# Patient Record
Sex: Female | Born: 1970 | Race: Black or African American | Hispanic: Yes | Marital: Married | Smoking: Never smoker
Health system: Southern US, Community
[De-identification: ages and names within clinical notes are randomized; demographics above are authoritative.]

## PROBLEM LIST (undated history)

## (undated) ENCOUNTER — Emergency Department: Disposition: A | Discharge status: Home / Self Care | Payer: Medicaid Other

## (undated) DIAGNOSIS — E669 Obesity, unspecified: Secondary | ICD-10-CM

## (undated) DIAGNOSIS — M545 Low back pain, unspecified: Secondary | ICD-10-CM

## (undated) DIAGNOSIS — J45909 Unspecified asthma, uncomplicated: Secondary | ICD-10-CM

## (undated) DIAGNOSIS — M1990 Unspecified osteoarthritis, unspecified site: Secondary | ICD-10-CM

## (undated) DIAGNOSIS — M3500 Sicca syndrome, unspecified: Secondary | ICD-10-CM

## (undated) DIAGNOSIS — L300 Nummular dermatitis: Secondary | ICD-10-CM

## (undated) DIAGNOSIS — E119 Type 2 diabetes mellitus without complications: Secondary | ICD-10-CM

## (undated) DIAGNOSIS — N83209 Unspecified ovarian cyst, unspecified side: Secondary | ICD-10-CM

## (undated) DIAGNOSIS — G4730 Sleep apnea, unspecified: Secondary | ICD-10-CM

## (undated) DIAGNOSIS — M4802 Spinal stenosis, cervical region: Secondary | ICD-10-CM

## (undated) HISTORY — PX: CHOLECYSTECTOMY: SHX55

## (undated) HISTORY — PX: ADENOIDECTOMY: SUR15

## (undated) HISTORY — PX: TONSILLECTOMY: SUR1361

---

## 2014-01-14 DIAGNOSIS — M545 Low back pain, unspecified: Secondary | ICD-10-CM | POA: Insufficient documentation

## 2014-03-13 DIAGNOSIS — M4802 Spinal stenosis, cervical region: Secondary | ICD-10-CM | POA: Insufficient documentation

## 2014-03-13 DIAGNOSIS — Z0000 Encounter for general adult medical examination without abnormal findings: Secondary | ICD-10-CM | POA: Insufficient documentation

## 2014-09-26 DIAGNOSIS — M3500 Sicca syndrome, unspecified: Secondary | ICD-10-CM | POA: Insufficient documentation

## 2014-12-05 DIAGNOSIS — N83202 Unspecified ovarian cyst, left side: Secondary | ICD-10-CM | POA: Insufficient documentation

## 2014-12-05 DIAGNOSIS — D259 Leiomyoma of uterus, unspecified: Secondary | ICD-10-CM | POA: Insufficient documentation

## 2015-05-15 DIAGNOSIS — L300 Nummular dermatitis: Secondary | ICD-10-CM | POA: Insufficient documentation

## 2015-05-15 DIAGNOSIS — G4733 Obstructive sleep apnea (adult) (pediatric): Secondary | ICD-10-CM | POA: Insufficient documentation

## 2016-04-05 DIAGNOSIS — J4520 Mild intermittent asthma, uncomplicated: Secondary | ICD-10-CM | POA: Insufficient documentation

## 2017-03-25 ENCOUNTER — Other Ambulatory Visit: Payer: Self-pay

## 2017-03-25 ENCOUNTER — Emergency Department
Admission: EM | Admit: 2017-03-25 | Discharge: 2017-03-25 | Disposition: A | Discharge status: Home / Self Care | Payer: Medicaid Other | Source: Home / Self Care | Attending: Family Medicine | Admitting: Family Medicine

## 2017-03-25 ENCOUNTER — Encounter: Payer: Self-pay | Admitting: *Deleted

## 2017-03-25 DIAGNOSIS — B9689 Other specified bacterial agents as the cause of diseases classified elsewhere: Secondary | ICD-10-CM | POA: Diagnosis not present

## 2017-03-25 DIAGNOSIS — J019 Acute sinusitis, unspecified: Secondary | ICD-10-CM | POA: Diagnosis not present

## 2017-03-25 HISTORY — DX: Type 2 diabetes mellitus without complications: E11.9

## 2017-03-25 LAB — POCT URINALYSIS DIP (MANUAL ENTRY)
BILIRUBIN UA: NEGATIVE
Blood, UA: NEGATIVE
Ketones, POC UA: NEGATIVE mg/dL
Nitrite, UA: NEGATIVE
Protein Ur, POC: NEGATIVE mg/dL
Spec Grav, UA: >=1.03 — AB (ref 1.010–1.025)
Urobilinogen, UA: 0.2 E.U./dL
pH, UA: 5.5 (ref 5.0–8.0)

## 2017-03-25 MED ORDER — PREDNISONE 20 MG PO TABS: ORAL_TABLET | ORAL | 0 refills | Status: DC

## 2017-03-25 MED ORDER — AZITHROMYCIN 250 MG PO TABS: 250.0000 mg | ORAL_TABLET | Freq: Every day | ORAL | 0 refills | Status: DC

## 2017-03-25 NOTE — Discharge Instructions (Signed)
°  You may take 500mg acetaminophen every 4-6 hours or in combination with ibuprofen 400-600mg every 6-8 hours as needed for pain, inflammation, and fever. ° °Be sure to drink at least eight 8oz glasses of water to stay well hydrated and get at least 8 hours of sleep at night, preferably more while sick.  ° °Please take antibiotics as prescribed and be sure to complete entire course even if you start to feel better to ensure infection does not come back. ° °

## 2017-03-25 NOTE — ED Triage Notes (Signed)
Pt c/o nasal congestion, productive cough, sinus pressure and bilateral ear pain x 2 wks. She also c/o dysuria x 10 days.

## 2017-03-25 NOTE — ED Provider Notes (Signed)
[name]    CSN: [id number] Arrival date & time: 03/25/17  1609     History   Chief Complaint Chief Complaint  Patient presents with  . Cough  . Dysuria    HPI Sierra Henderson is a 46 y.o. female.   HPI Sierra Henderson is a 46 y.o. female presenting to UC with c/o gradually worsening chest tightness with cough, congestion, hoarse voice, sinus pressure and pain for 2 weeks. Associated bilateral ear pain.  She has also had dysuria for 10 days but denies hematuria or frequency.  She has taken OTC cough/cold medication with minimal relief. Hx of sinus infections, symptoms feel similar. Pt recently moved from [place].  Pt notes she does well with Azithromycin but has stomach upset from Augmentin.    Past Medical History:  Diagnosis Date  . Diabetes mellitus without complication ([place])     There are no active problems to display for this patient.   Past Surgical History:  Procedure Laterality Date  . ADENOIDECTOMY    . CESAREAN SECTION    . CHOLECYSTECTOMY    . TONSILLECTOMY      OB History    No data available       Home Medications    Prior to Admission medications   Medication Sig Start Date End Date Taking? Authorizing Provider  glipiZIDE (GLUCOTROL) 10 MG tablet Take 10 mg by mouth daily before breakfast.   Yes [provider]  azithromycin (ZITHROMAX) 250 MG tablet Take 1 tablet (250 mg total) by mouth daily. Take first 2 tablets together, then 1 every day until finished. 03/25/17   Noe Gens, PA-C  predniSONE (DELTASONE) 20 MG tablet 3 tabs po day one, then 2 po daily x 4 days 03/25/17   Noe Gens, PA-C    Family History Family History  Problem Relation Age of Onset  . Hypertension Mother   . Diabetes Mother   . Hypertension Father   . Diabetes Father     Social History Social History   Tobacco Use  . Smoking status: Never Smoker  . Smokeless tobacco: Never Used  Substance Use Topics  . Alcohol use: Yes   Comment: socially  . Drug use: No     Allergies   Latex; Metformin and related; and Augmentin [amoxicillin-pot clavulanate]   Review of Systems Review of Systems  Constitutional: Negative for chills and fever.  HENT: Positive for congestion, ear pain (bilateral), postnasal drip, sinus pressure, sinus pain and sore throat. Negative for trouble swallowing and voice change.   Respiratory: Positive for cough. Negative for shortness of breath.   Cardiovascular: Negative for chest pain and palpitations.  Gastrointestinal: Negative for abdominal pain, diarrhea, nausea and vomiting.  Musculoskeletal: Negative for arthralgias, back pain and myalgias.  Skin: Negative for rash.  Neurological: Positive for headaches. Negative for dizziness and light-headedness.     Physical Exam Triage Vital Signs ED Triage Vitals  Enc Vitals Group     BP 03/25/17 1639 (!) 143/79     Pulse Rate 03/25/17 1639 100     Resp 03/25/17 1639 18     Temp 03/25/17 1639 98.5 F (36.9 C)     Temp Source 03/25/17 1639 Oral     SpO2 03/25/17 1639 96 %     Weight 03/25/17 1640 190 lb (86.2 kg)     Height 03/25/17 1640 5\' 3"  (1.6 m)     Head Circumference --      Peak Flow --  Pain Score 03/25/17 1640 0     Pain Loc --      Pain Edu? --      Excl. in [place]? --    No data found.  Updated Vital Signs BP (!) 143/79 (BP Location: Right Arm)   Pulse 100   Temp 98.5 F (36.9 C) (Oral)   Resp 18   Ht 5\' 3"  (1.6 m)   Wt 190 lb (86.2 kg)   LMP 03/11/2017   SpO2 96%   BMI 33.66 kg/m   Visual Acuity Right Eye Distance:   Left Eye Distance:   Bilateral Distance:    Right Eye Near:   Left Eye Near:    Bilateral Near:     Physical Exam  Constitutional: She is oriented to person, place, and time. She appears well-developed and well-nourished. No distress.  HENT:  Head: Normocephalic and atraumatic.  Right Ear: Tympanic membrane normal.  Left Ear: Tympanic membrane normal.  Nose: Mucosal edema present.  Right sinus exhibits maxillary sinus tenderness and frontal sinus tenderness. Left sinus exhibits maxillary sinus tenderness and frontal sinus tenderness.  Mouth/Throat: Uvula is midline, oropharynx is clear and moist and mucous membranes are normal.  Eyes: EOM are normal.  Neck: Normal range of motion. Neck supple.  Cardiovascular: Normal rate and regular rhythm.  Pulmonary/Chest: Effort normal and breath sounds normal. No stridor. No respiratory distress. She has no wheezes. She has no rales.  Musculoskeletal: Normal range of motion.  Lymphadenopathy:    She has no cervical adenopathy.  Neurological: She is alert and oriented to person, place, and time.  Skin: Skin is warm and dry. She is not diaphoretic.  Psychiatric: She has a normal mood and affect. Her behavior is normal.  Nursing note and vitals reviewed.    UC Treatments / Results  Labs (all labs ordered are listed, but only abnormal results are displayed) Labs Reviewed  POCT URINALYSIS DIP (MANUAL ENTRY) - Abnormal; Notable for the following components:      Result Value   Glucose, UA =500 (*)    Spec Grav, UA >=1.030 (*)    Leukocytes, UA Trace (*)    All other components within normal limits    EKG  EKG Interpretation None       Radiology No results found.  Procedures Procedures (including critical care time)  Medications Ordered in UC Medications - No data to display   Initial Impression / Assessment and Plan / UC Course  I have reviewed the triage vital signs and the nursing notes.  Pertinent labs & imaging results that were available during my care of the patient were reviewed by me and considered in my medical decision making (see chart for details).    UA: unremarkable  Will tx for bacterial sinusitis F/u with PCP in 1 week if not improving.   Final Clinical Impressions(s) / UC Diagnoses   Final diagnoses:  Acute bacterial rhinosinusitis    ED Discharge Orders        Ordered     azithromycin (ZITHROMAX) 250 MG tablet  Daily     03/25/17 1645    predniSONE (DELTASONE) 20 MG tablet     03/25/17 1645       Controlled Substance Prescriptions [place] Controlled Substance Registry consulted? Not Applicable   Tyrell Antonio 03/25/17 1734

## 2017-06-14 ENCOUNTER — Emergency Department
Admission: EM | Admit: 2017-06-14 | Discharge: 2017-06-14 | Disposition: A | Discharge status: Home / Self Care | Payer: Medicaid Other | Source: Home / Self Care | Attending: Family Medicine | Admitting: Family Medicine

## 2017-06-14 ENCOUNTER — Other Ambulatory Visit: Payer: Self-pay

## 2017-06-14 DIAGNOSIS — B3731 Acute candidiasis of vulva and vagina: Secondary | ICD-10-CM

## 2017-06-14 DIAGNOSIS — B373 Candidiasis of vulva and vagina: Secondary | ICD-10-CM

## 2017-06-14 HISTORY — DX: Sjogren syndrome, unspecified: M35.00

## 2017-06-14 HISTORY — DX: Nummular dermatitis: L30.0

## 2017-06-14 HISTORY — DX: Sleep apnea, unspecified: G47.30

## 2017-06-14 HISTORY — DX: Unspecified asthma, uncomplicated: J45.909

## 2017-06-14 HISTORY — DX: Low back pain, unspecified: M54.50

## 2017-06-14 HISTORY — DX: Unspecified osteoarthritis, unspecified site: M19.90

## 2017-06-14 HISTORY — DX: Spinal stenosis, cervical region: M48.02

## 2017-06-14 HISTORY — DX: Obesity, unspecified: E66.9

## 2017-06-14 HISTORY — DX: Low back pain: M54.5

## 2017-06-14 HISTORY — DX: Unspecified ovarian cyst, unspecified side: N83.209

## 2017-06-14 LAB — POCT WET + KOH PREP

## 2017-06-14 MED ORDER — TERCONAZOLE 0.4 % VA CREA: TOPICAL_CREAM | VAGINAL | 0 refills | Status: DC

## 2017-06-14 MED ORDER — FLUCONAZOLE 150 MG PO TABS: ORAL_TABLET | ORAL | 0 refills | Status: DC

## 2017-06-14 NOTE — ED Triage Notes (Signed)
Pt thinks she has a yeast infection, has vaginal itching, burning and discharge.  Would also like to be STD tested

## 2017-06-14 NOTE — ED Provider Notes (Signed)
[name]    CSN: [id number] Arrival date & time: 06/14/17  1615     History   Chief Complaint Chief Complaint  Patient presents with  . Vaginal Discharge  . Vaginal Itching    HPI Sierra Henderson is a 47 y.o. female.   Patient reports that she was treated for a vaginal yeast infection recently with Terazol 3 vaginal cream.  Her symptoms improved but she still has some vaginal itching, burning, and mild discharge.  She denies pelvic pain.  Patient's last menstrual period was 06/02/2017.  She would also like to be tested for STD.   The history is provided by the patient.    Past Medical History:  Diagnosis Date  . Arthritis   . Asthma   . Cervical spinal stenosis   . Diabetes mellitus without complication ([place])   . Low back pain   . Nummular dermatitis   . Obesity   . Ovarian cyst   . 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 Sleep apnea     There are no active problems to display for this patient.   Past Surgical History:  Procedure Laterality Date  . ADENOIDECTOMY    . CESAREAN SECTION    . CHOLECYSTECTOMY    . TONSILLECTOMY      OB History    No data available       Home Medications    Prior to Admission medications   Medication Sig Start Date End Date Taking? Authorizing Provider  albuterol (PROVENTIL) (5 MG/ML) 0.5% nebulizer solution Take 2.5 mg by nebulization every 6 (six) hours as needed for wheezing or shortness of breath.   Yes [provider]  calcium-vitamin D (OSCAL WITH D) 250-125 MG-UNIT tablet Take 1 tablet by mouth daily.   Yes [provider]  cetirizine-pseudoephedrine (ZYRTEC-D) 5-120 MG tablet Take 1 tablet by mouth 2 (two) times daily.   Yes [provider]  chlorhexidine (PERIDEX) 0.12 % solution Use as directed 15 mLs in the mouth or throat 2 (two) times daily.   Yes [provider]  gabapentin (NEURONTIN) 300 MG capsule Take 300 mg by mouth 2 (two) times daily.   Yes [provider]    hydroxychloroquine (PLAQUENIL) 200 MG tablet Take by mouth 2 (two) times daily.   Yes [provider]  levonorgestrel (MIRENA) 20 MCG/24HR IUD 1 each by Intrauterine route once.   Yes [provider]  meloxicam (MOBIC) 7.5 MG tablet Take 7.5 mg by mouth daily.   Yes [provider]  metroNIDAZOLE (METROGEL) 0.75 % vaginal gel Place 1 Applicatorful vaginally 2 (two) times daily.   Yes [provider]  nystatin (MYCOSTATIN/NYSTOP) powder Apply topically 3 (three) times daily.   Yes [provider]  pilocarpine (SALAGEN) 5 MG tablet Take by mouth 3 (three) times daily.   Yes [provider]  tizanidine (ZANAFLEX) 2 MG capsule Take 2 mg by mouth 3 (three) times daily.   Yes [provider]  triamcinolone ointment (KENALOG) 0.5 % Apply 1 application topically 2 (two) times daily.   Yes [provider]  azithromycin (ZITHROMAX) 250 MG tablet Take 1 tablet (250 mg total) by mouth daily. Take first 2 tablets together, then 1 every day until finished. 03/25/17   Noe Gens, PA-C  fluconazole (DIFLUCAN) 150 MG tablet Take one tab by mouth for one dose.  May repeat in 72 hours. 06/14/17   Kandra Nicolas, MD  glipiZIDE (GLUCOTROL) 10 MG tablet Take 10 mg by  mouth daily before breakfast.    [provider]  predniSONE (DELTASONE) 20 MG tablet 3 tabs po day one, then 2 po daily x 4 days 03/25/17   Noe Gens, PA-C  terconazole (TERAZOL 7) 0.4 % vaginal cream Apply once daily for 3 to 5 days as directed. 06/14/17   Kandra Nicolas, MD    Family History Family History  Problem Relation Age of Onset  . Hypertension Mother   . Diabetes Mother   . Hypertension Father   . Diabetes Father     Social History Social History   Tobacco Use  . Smoking status: Never Smoker  . Smokeless tobacco: Never Used  Substance Use Topics  . Alcohol use: Yes    Comment: socially  . Drug use: No     Allergies   Latex; Metformin  and related; and Augmentin [amoxicillin-pot clavulanate]   Review of Systems Review of Systems  Constitutional: Negative for activity change, chills, diaphoresis, fatigue and fever.  HENT: Negative.   Eyes: Negative.   Cardiovascular: Negative.   Gastrointestinal: Negative.   Genitourinary: Positive for vaginal discharge. Negative for difficulty urinating, dysuria, flank pain, frequency, genital sores, hematuria, pelvic pain, urgency, vaginal bleeding and vaginal pain.  Musculoskeletal: Negative.   Skin: Negative.      Physical Exam Triage Vital Signs ED Triage Vitals  Enc Vitals Group     BP 06/14/17 1716 129/83     Pulse Rate 06/14/17 1716 90     Resp --      Temp 06/14/17 1716 98.2 F (36.8 C)     Temp Source 06/14/17 1716 Oral     SpO2 06/14/17 1716 97 %     Weight 06/14/17 1717 190 lb (86.2 kg)     Height 06/14/17 1717 5\' 3"  (1.6 m)     Head Circumference --      Peak Flow --      Pain Score 06/14/17 1717 0     Pain Loc --      Pain Edu? --      Excl. in [place]? --    No data found.  Updated Vital Signs BP 129/83 (BP Location: Right Arm)   Pulse 90   Temp 98.2 F (36.8 C) (Oral)   Ht 5\' 3"  (1.6 m)   Wt 190 lb (86.2 kg)   LMP 06/02/2017   SpO2 97%   BMI 33.66 kg/m   Visual Acuity Right Eye Distance:   Left Eye Distance:   Bilateral Distance:    Right Eye Near:   Left Eye Near:    Bilateral Near:     Physical Exam Nursing notes and Vital Signs reviewed. Appearance:  Patient appears stated age, and in no acute distress.    Eyes:  Pupils are equal, round, and reactive to light and accomodation.  Extraocular movement is intact.  Conjunctivae are not inflamed   Pharynx:  Normal; moist mucous membranes  Neck:  Supple.  No adenopathy Lungs:  Clear to auscultation.  Breath sounds are equal.  Moving air well. Heart:  Regular rate and rhythm without murmurs, rubs, or gallops.  Abdomen:  Nontender without masses or hepatosplenomegaly.  Bowel sounds are present.   No CVA or flank tenderness.  Extremities:  No edema.  Skin:  No rash present.    Pelvic exam deferred  UC Treatments / Results  Labs (all labs ordered are listed, but only abnormal results are displayed) Labs Reviewed  POCT WET + KOH PREP - Abnormal; Notable  for the following components:      Result Value   Yeast by KOH Present (*)    Yeast by wet prep Present (*)    WBC by wet prep None (*)    All other components within normal limits  C. TRACHOMATIS/N. GONORRHOEAE RNA    EKG  EKG Interpretation None       Radiology No results found.  Procedures Procedures (including critical care time)  Medications Ordered in UC Medications - No data to display   Initial Impression / Assessment and Plan / UC Course  I have reviewed the triage vital signs and the nursing notes.  Pertinent labs & imaging results that were available during my care of the patient were reviewed by me and considered in my medical decision making (see chart for details).    Diflucan 150mg  (two doses, 72 hours apart).  Rx for Terazol cream externally. GC/chlamydia pending. Followup with GYN if not improving.    Final Clinical Impressions(s) / UC Diagnoses   Final diagnoses:  Candida vaginitis    ED Discharge Orders        Ordered    fluconazole (DIFLUCAN) 150 MG tablet     06/14/17 1826    terconazole (TERAZOL 7) 0.4 % vaginal cream     06/14/17 1826          Kandra Nicolas, MD 06/18/17 1758

## 2017-06-15 ENCOUNTER — Telehealth: Payer: Self-pay

## 2017-06-15 LAB — C. TRACHOMATIS/N. GONORRHOEAE RNA
C. TRACHOMATIS RNA, TMA: NOT DETECTED
N. gonorrhoeae RNA, TMA: NOT DETECTED

## 2017-06-15 NOTE — Telephone Encounter (Signed)
Pt called back, results given

## 2017-06-15 NOTE — Telephone Encounter (Signed)
Left message to call for lab results.  Contact information given.

## 2017-07-18 ENCOUNTER — Other Ambulatory Visit: Payer: Self-pay

## 2017-07-18 ENCOUNTER — Encounter: Payer: Self-pay | Admitting: *Deleted

## 2017-07-18 ENCOUNTER — Emergency Department (INDEPENDENT_AMBULATORY_CARE_PROVIDER_SITE_OTHER)
Admission: EM | Admit: 2017-07-18 | Discharge: 2017-07-18 | Disposition: A | Discharge status: Home / Self Care | Payer: Medicaid Other | Source: Home / Self Care

## 2017-07-18 DIAGNOSIS — Z111 Encounter for screening for respiratory tuberculosis: Secondary | ICD-10-CM | POA: Diagnosis not present

## 2017-07-18 MED ORDER — TUBERCULIN PPD 5 UNIT/0.1ML ID SOLN
5.0000 [IU] | Freq: Once | INTRADERMAL | Status: DC
  Administered 2017-07-18: 5 [IU] via INTRADERMAL

## 2017-07-18 NOTE — ED Triage Notes (Signed)
The patient is here today for PPD placement. Charna Archer, LPN

## 2017-07-20 ENCOUNTER — Other Ambulatory Visit: Payer: Self-pay

## 2017-07-20 ENCOUNTER — Emergency Department
Admission: EM | Admit: 2017-07-20 | Discharge: 2017-07-20 | Disposition: A | Discharge status: Home / Self Care | Payer: Medicaid Other | Source: Home / Self Care

## 2017-07-20 LAB — READ PPD: TB SKIN TEST: NEGATIVE

## 2017-07-20 NOTE — ED Triage Notes (Addendum)
Patient presented today for follow up of PPD placed on     07/18/17 . Examination: O2 97%  P: 106 , right forearm inspected and no signs of induration or redness. Provided test results to patient and counseled to monitor site for signs of swelling or redness. If s/s present return to the clinic or proceed to the ED for further evaluation. Charna Archer, LPN

## 2017-11-03 ENCOUNTER — Emergency Department (INDEPENDENT_AMBULATORY_CARE_PROVIDER_SITE_OTHER)
Admission: EM | Admit: 2017-11-03 | Discharge: 2017-11-03 | Disposition: A | Discharge status: Home / Self Care | Payer: Medicaid Other | Source: Home / Self Care | Attending: Family Medicine | Admitting: Family Medicine

## 2017-11-03 ENCOUNTER — Encounter: Payer: Self-pay | Admitting: Emergency Medicine

## 2017-11-03 ENCOUNTER — Other Ambulatory Visit: Payer: Self-pay

## 2017-11-03 ENCOUNTER — Emergency Department (INDEPENDENT_AMBULATORY_CARE_PROVIDER_SITE_OTHER): Payer: Medicaid Other

## 2017-11-03 DIAGNOSIS — R358 Other polyuria: Secondary | ICD-10-CM | POA: Diagnosis not present

## 2017-11-03 DIAGNOSIS — R05 Cough: Secondary | ICD-10-CM

## 2017-11-03 DIAGNOSIS — R079 Chest pain, unspecified: Secondary | ICD-10-CM | POA: Diagnosis not present

## 2017-11-03 DIAGNOSIS — D171 Benign lipomatous neoplasm of skin and subcutaneous tissue of trunk: Secondary | ICD-10-CM

## 2017-11-03 DIAGNOSIS — J069 Acute upper respiratory infection, unspecified: Secondary | ICD-10-CM

## 2017-11-03 DIAGNOSIS — R3589 Other polyuria: Secondary | ICD-10-CM

## 2017-11-03 DIAGNOSIS — B9789 Other viral agents as the cause of diseases classified elsewhere: Secondary | ICD-10-CM

## 2017-11-03 LAB — POCT URINALYSIS DIP (MANUAL ENTRY)
Bilirubin, UA: NEGATIVE
Glucose, UA: >=1000 mg/dL — AB
Leukocytes, UA: NEGATIVE
Nitrite, UA: NEGATIVE
PH UA: 6 (ref 5.0–8.0)
PROTEIN UA: NEGATIVE mg/dL
Spec Grav, UA: 1.01 (ref 1.010–1.025)
Urobilinogen, UA: 0.2 E.U./dL

## 2017-11-03 MED ORDER — BENZONATATE 200 MG PO CAPS: ORAL_CAPSULE | ORAL | 0 refills | Status: DC

## 2017-11-03 MED ORDER — AZITHROMYCIN 250 MG PO TABS: ORAL_TABLET | ORAL | 0 refills | Status: DC

## 2017-11-03 NOTE — ED Triage Notes (Signed)
Polyuria, body aches, Left flank pain x 2 days

## 2017-11-03 NOTE — ED Provider Notes (Signed)
[name]    CSN: [id number] Arrival date & time: 11/03/17  1859     History   Chief Complaint Chief Complaint  Patient presents with  . Polyuria    HPI Sierra Henderson is a 47 y.o. female.   About one week ago patient noticed increased urinary frequency and urgency without dysuria.  During the past several days she has developed myalgias, sore throat, sinus congestion, non-productive cough, chills, and occasional wheezing.  She complains of tightness in her anterior chest, especially the left anterior inferior ribs, and is concerned that there is a mass in her area of soreness. She has seasonal rhinitis for which she takes Zyzal. She has a past history of pneumonia.  The history is provided by the patient.    Past Medical History:  Diagnosis Date  . Arthritis   . Asthma   . Cervical spinal stenosis   . Diabetes mellitus without complication ([place])   . Low back pain   . Nummular dermatitis   . Obesity   . Ovarian cyst   . Sjoegren syndrome   . Sleep apnea     There are no active problems to display for this patient.   Past Surgical History:  Procedure Laterality Date  . ADENOIDECTOMY    . CESAREAN SECTION    . CHOLECYSTECTOMY    . TONSILLECTOMY      OB History   None      Home Medications    Prior to Admission medications   Medication Sig Start Date End Date Taking? Authorizing Provider  albuterol (PROVENTIL) (5 MG/ML) 0.5% nebulizer solution Take 2.5 mg by nebulization every 6 (six) hours as needed for wheezing or shortness of breath.    [provider]  azithromycin (ZITHROMAX Z-PAK) 250 MG tablet Take 2 tabs today; then begin one tab once daily for 4 more days. (Rx void after 11/11/17) 11/03/17   Kandra Nicolas, MD  benzonatate (TESSALON) 200 MG capsule Take one cap by mouth at bedtime as needed for cough.  May repeat in 4 to 6 hours 11/03/17   Kandra Nicolas, MD  calcium-vitamin D (OSCAL WITH D) 250-125 MG-UNIT tablet Take 1  tablet by mouth daily.    [provider]  cetirizine-pseudoephedrine (ZYRTEC-D) 5-120 MG tablet Take 1 tablet by mouth 2 (two) times daily.    [provider]  chlorhexidine (PERIDEX) 0.12 % solution Use as directed 15 mLs in the mouth or throat 2 (two) times daily.    [provider]  gabapentin (NEURONTIN) 300 MG capsule Take 300 mg by mouth 2 (two) times daily.    [provider]  glipiZIDE (GLUCOTROL) 10 MG tablet Take 10 mg by mouth daily before breakfast.    [provider]  hydroxychloroquine (PLAQUENIL) 200 MG tablet Take by mouth 2 (two) times daily.    [provider]  levonorgestrel (MIRENA) 20 MCG/24HR IUD 1 each by Intrauterine route once.    [provider]  meloxicam (MOBIC) 7.5 MG tablet Take 7.5 mg by mouth daily.    [provider]  metroNIDAZOLE (METROGEL) 0.75 % vaginal gel Place 1 Applicatorful vaginally 2 (two) times daily.    [provider]  nystatin (MYCOSTATIN/NYSTOP) powder Apply topically 3 (three) times daily.    [provider]  pilocarpine (SALAGEN) 5 MG tablet Take by mouth 3 (three) times daily.    [provider]  tizanidine (ZANAFLEX) 2 MG capsule Take 2 mg by mouth 3 (three) times daily.  [provider]  triamcinolone ointment (KENALOG) 0.5 % Apply 1 application topically 2 (two) times daily.    [provider]    Family History Family History  Problem Relation Age of Onset  . Hypertension Mother   . Diabetes Mother   . Hypertension Father   . Diabetes Father     Social History Social History   Tobacco Use  . Smoking status: Never Smoker  . Smokeless tobacco: Never Used  Substance Use Topics  . Alcohol use: Yes    Comment: socially  . Drug use: No     Allergies   Latex; Metformin and related; and Augmentin [amoxicillin-pot clavulanate]   Review of Systems Review of Systems + sore throat + cough ? pleuritic pain +  wheezing + nasal congestion + post-nasal drainage No sinus pain/pressure No itchy/red eyes No earache No hemoptysis No SOB No fever, + chills No nausea No vomiting No abdominal pain No diarrhea + urinary frequency and urgency  No skin rash + fatigue No myalgias No headache Used OTC meds without relief   Physical Exam Triage Vital Signs ED Triage Vitals  Enc Vitals Group     BP 11/03/17 1916 120/83     Pulse Rate 11/03/17 1916 92     Resp --      Temp 11/03/17 1916 98.5 F (36.9 C)     Temp Source 11/03/17 1916 Oral     SpO2 11/03/17 1916 99 %     Weight 11/03/17 1917 188 lb (85.3 kg)     Height 11/03/17 1917 5\' 3"  (1.6 m)     Head Circumference --      Peak Flow --      Pain Score 11/03/17 1916 8     Pain Loc --      Pain Edu? --      Excl. in [place]? --    No data found.  Updated Vital Signs BP 120/83 (BP Location: Right Arm)   Pulse 92   Temp 98.5 F (36.9 C) (Oral)   Ht 5\' 3"  (1.6 m)   Wt 188 lb (85.3 kg)   LMP 10/31/2017   SpO2 99%   BMI 33.30 kg/m   Visual Acuity Right Eye Distance:   Left Eye Distance:   Bilateral Distance:    Right Eye Near:   Left Eye Near:    Bilateral Near:     Physical Exam  Pulmonary/Chest:  Tenderness left anterior/inferior ribs.  Overlying tissue feels somewhat full.     Nursing notes and Vital Signs reviewed. Appearance:  Patient appears stated age, and in no acute distress Eyes:  Pupils are equal, round, and reactive to light and accomodation.  Extraocular movement is intact.  Conjunctivae are not inflamed  Ears:  Canals normal.  Tympanic membranes normal.  Nose:  Mildly congested turbinates.  No sinus tenderness.   Pharynx:  Normal Neck:  Supple.  Enlarged posterior/lateral nodes are palpated bilaterally, tender to palpation on the left.   Lungs:  Clear to auscultation.  Breath sounds are equal.  Moving air well. Chest:  See diagram above. Heart:  Regular rate and rhythm without murmurs, rubs, or gallops.    Abdomen:  Nontender without masses or hepatosplenomegaly.  Bowel sounds are present.  No CVA or flank tenderness.  Extremities:  No edema.  Skin:  No rash present.    UC Treatments / Results  Labs (all labs ordered are listed, but only abnormal results are displayed) Labs Reviewed  POCT URINALYSIS DIP (  MANUAL ENTRY) - Abnormal; Notable for the following components:      Result Value   Glucose, UA >=1,000 (*)    Ketones, POC UA trace (5) (*)    Blood, UA trace-lysed (*)    All other components within normal limits  URINE CULTURE    EKG None  Radiology Dg Chest 2 View  Result Date: 11/03/2017 CLINICAL DATA:  47 year-old female c/o non productive cough and chest pain x a few days. Pt denies fever. EXAM: CHEST - 2 VIEW COMPARISON:  None. FINDINGS: Midline trachea.  Normal heart size and mediastinal contours. Sharp costophrenic angles.  No pneumothorax.  Clear lungs. Cholecystectomy clips.  Convex right thoracic spine curvature. IMPRESSION: No active cardiopulmonary disease. Electronically Signed   By: Abigail Miyamoto M.D.   On: 11/03/2017 20:17    Procedures Procedures (including critical care time)  Medications Ordered in UC Medications - No data to display  Initial Impression / Assessment and Plan / UC Course  I have reviewed the triage vital signs and the nursing notes.  Pertinent labs & imaging results that were available during my care of the patient were reviewed by me and considered in my medical decision making (see chart for details).    Suspect tenderness left anterior/inferior chest represents lipoma; suggest that she follow-up with her PCP for further evaluation. There is no evidence of bacterial infection today.  Treat symptomatically for now. Suspect urine frequency a result of poorly controlled diabetes.  Patient has follow-up appointment with her PCP on 11/11/17.  However, will send urine culture.     Final Clinical Impressions(s) / UC Diagnoses   Final  diagnoses:  Polyuria  Viral URI with cough  Lipoma of chest wall     Discharge Instructions     Take plain guaifenesin (1200mg  extended release tabs such as Mucinex) twice daily, with plenty of water, for cough and congestion.  May add Pseudoephedrine (30mg , one or two every 4 to 6 hours) for sinus congestion.  Get adequate rest.   May use Afrin nasal spray (or generic oxymetazoline) each morning for about 5 days and then discontinue.  Also recommend using saline nasal spray several times daily and saline nasal irrigation (AYR is a common brand).  Use Flonase nasal spray each morning after using Afrin nasal spray and saline nasal irrigation. Try warm salt water gargles for sore throat.  May take Delsym Cough Suppressant with Tessalon at bedtime for nighttime cough.  Stop all antihistamines for now, and other non-prescription cough/cold preparations. May take Ibuprofen 200mg , 4 tabs every 8 hours with food for chest/sternum discomfort. Begin Azithromycin if not improving about one week or if persistent fever develops (stop taking Plaquenil while taking azithromycin) (Given a prescription to hold, with an expiration date)       ED Prescriptions    Medication Sig Dispense Auth. Provider   benzonatate (TESSALON) 200 MG capsule Take one cap by mouth at bedtime as needed for cough.  May repeat in 4 to 6 hours 15 capsule Kandra Nicolas, MD   azithromycin (ZITHROMAX Z-PAK) 250 MG tablet Take 2 tabs today; then begin one tab once daily for 4 more days. (Rx void after 11/11/17) 6 tablet Kandra Nicolas, MD        Kandra Nicolas, MD 11/10/17 [phone]

## 2017-11-03 NOTE — Discharge Instructions (Addendum)
Take plain guaifenesin (1200mg  extended release tabs such as Mucinex) twice daily, with plenty of water, for cough and congestion.  May add Pseudoephedrine (30mg , one or two every 4 to 6 hours) for sinus congestion.  Get adequate rest.   May use Afrin nasal spray (or generic oxymetazoline) each morning for about 5 days and then discontinue.  Also recommend using saline nasal spray several times daily and saline nasal irrigation (AYR is a common brand).  Use Flonase nasal spray each morning after using Afrin nasal spray and saline nasal irrigation. Try warm salt water gargles for sore throat.  May take Delsym Cough Suppressant with Tessalon at bedtime for nighttime cough.  Stop all antihistamines for now, and other non-prescription cough/cold preparations. May take Ibuprofen 200mg , 4 tabs every 8 hours with food for chest/sternum discomfort. Begin Azithromycin if not improving about one week or if persistent fever develops (stop taking Plaquenil while taking azithromycin)

## 2017-11-05 ENCOUNTER — Telehealth: Payer: Self-pay | Admitting: Family Medicine

## 2017-11-05 ENCOUNTER — Telehealth: Payer: Self-pay | Admitting: Emergency Medicine

## 2017-11-05 LAB — URINE CULTURE
MICRO NUMBER: 90823181
SPECIMEN QUALITY: ADEQUATE

## 2017-11-05 MED ORDER — CEPHALEXIN 500 MG PO CAPS: 500.0000 mg | ORAL_CAPSULE | Freq: Two times a day (BID) | ORAL | 0 refills | Status: DC

## 2017-11-05 NOTE — Telephone Encounter (Signed)
Contact patient to advise she can discontinue taking the azithromycin as her recent year urine culture indicates that she has a UTI with E. coli as the underlying bacterial organism.  I am E prescribing Keflex 500 mg twice daily x10 days. This  will provide coverage for both the UTI and any underlying respiratory infection.    Sierra Henderson. Kenton Kingfisher, MSN, FNP-C [facility]

## 2017-11-14 DIAGNOSIS — B372 Candidiasis of skin and nail: Secondary | ICD-10-CM | POA: Insufficient documentation

## 2017-11-14 DIAGNOSIS — D582 Other hemoglobinopathies: Secondary | ICD-10-CM | POA: Insufficient documentation

## 2017-11-14 DIAGNOSIS — E1165 Type 2 diabetes mellitus with hyperglycemia: Secondary | ICD-10-CM | POA: Insufficient documentation

## 2017-11-14 DIAGNOSIS — E559 Vitamin D deficiency, unspecified: Secondary | ICD-10-CM | POA: Insufficient documentation

## 2018-04-27 ENCOUNTER — Other Ambulatory Visit: Payer: Self-pay

## 2018-04-27 ENCOUNTER — Emergency Department (INDEPENDENT_AMBULATORY_CARE_PROVIDER_SITE_OTHER)
Admission: EM | Admit: 2018-04-27 | Discharge: 2018-04-27 | Disposition: A | Discharge status: Home / Self Care | Payer: Medicaid Other | Source: Home / Self Care | Attending: Family Medicine | Admitting: Family Medicine

## 2018-04-27 DIAGNOSIS — J029 Acute pharyngitis, unspecified: Secondary | ICD-10-CM | POA: Diagnosis not present

## 2018-04-27 DIAGNOSIS — B370 Candidal stomatitis: Secondary | ICD-10-CM

## 2018-04-27 LAB — POCT RAPID STREP A (OFFICE): Rapid Strep A Screen: NEGATIVE

## 2018-04-27 MED ORDER — FLUCONAZOLE 200 MG PO TABS: 200.0000 mg | ORAL_TABLET | Freq: Every day | ORAL | 0 refills | Status: DC

## 2018-04-27 NOTE — Discharge Instructions (Signed)
  You may take 500mg acetaminophen every 4-6 hours or in combination with ibuprofen 400-600mg every 6-8 hours as needed for pain, inflammation, and fever.  Be sure to well hydrated with clear liquids and get at least 8 hours of sleep at night, preferably more while sick.   Please follow up with family medicine in 1 week if needed.   

## 2018-04-27 NOTE — ED Provider Notes (Signed)
[name]    CSN: [id number] Arrival date & time: 04/27/18  1902     History   Chief Complaint Chief Complaint  Patient presents with  . Sore Throat  . Otalgia    HPI Shannel Zahm is a 48 y.o. female.   HPI Deliliah Spranger is a 48 y.o. female presenting to UC with c/o feeling sick since yesterday with associated red and white painful tongue and sore throat with Left ear pain, mild cough and post-nasal drainage. Denies fever, chills, n/v/d. No medication tried PTA. Hx of strep and oral thrush before.   Past Medical History:  Diagnosis Date  . Arthritis   . Asthma   . Cervical spinal stenosis   . Diabetes mellitus without complication ([place])   . Low back pain   . Nummular dermatitis   . Obesity   . Ovarian cyst   . Sjoegren syndrome   . Sleep apnea     There are no active problems to display for this patient.   Past Surgical History:  Procedure Laterality Date  . ADENOIDECTOMY    . CESAREAN SECTION    . CHOLECYSTECTOMY    . TONSILLECTOMY      OB History   No obstetric history on file.      Home Medications    Prior to Admission medications   Medication Sig Start Date End Date Taking? Authorizing Provider  albuterol (PROVENTIL) (5 MG/ML) 0.5% nebulizer solution Take 2.5 mg by nebulization every 6 (six) hours as needed for wheezing or shortness of breath.    [provider]  azithromycin (ZITHROMAX Z-PAK) 250 MG tablet Take 2 tabs today; then begin one tab once daily for 4 more days. (Rx void after 11/11/17) 11/03/17   Kandra Nicolas, MD  benzonatate (TESSALON) 200 MG capsule Take one cap by mouth at bedtime as needed for cough.  May repeat in 4 to 6 hours 11/03/17   Kandra Nicolas, MD  calcium-vitamin D (OSCAL WITH D) 250-125 MG-UNIT tablet Take 1 tablet by mouth daily.    [provider]  cephALEXin (KEFLEX) 500 MG capsule Take 1 capsule (500 mg total) by mouth 2 (two) times daily. 11/05/17   Scot Jun, FNP    cetirizine-pseudoephedrine (ZYRTEC-D) 5-120 MG tablet Take 1 tablet by mouth 2 (two) times daily.    [provider]  chlorhexidine (PERIDEX) 0.12 % solution Use as directed 15 mLs in the mouth or throat 2 (two) times daily.    [provider]  fluconazole (DIFLUCAN) 200 MG tablet Take 1 tablet (200 mg total) by mouth daily. 04/27/18   Noe Gens, PA-C  gabapentin (NEURONTIN) 300 MG capsule Take 300 mg by mouth 2 (two) times daily.    [provider]  glipiZIDE (GLUCOTROL) 10 MG tablet Take 10 mg by mouth daily before breakfast.    [provider]  hydroxychloroquine (PLAQUENIL) 200 MG tablet Take by mouth 2 (two) times daily.    [provider]  levonorgestrel (MIRENA) 20 MCG/24HR IUD 1 each by Intrauterine route once.    [provider]  meloxicam (MOBIC) 7.5 MG tablet Take 7.5 mg by mouth daily.    [provider]  metroNIDAZOLE (METROGEL) 0.75 % vaginal gel Place 1 Applicatorful vaginally 2 (two) times daily.    [provider]  nystatin (MYCOSTATIN/NYSTOP) powder Apply topically 3 (three) times daily.    [provider]  pilocarpine (SALAGEN) 5 MG tablet Take by mouth 3 (three) times daily.  [provider]  tizanidine (ZANAFLEX) 2 MG capsule Take 2 mg by mouth 3 (three) times daily.    [provider]  triamcinolone ointment (KENALOG) 0.5 % Apply 1 application topically 2 (two) times daily.    [provider]    Family History Family History  Problem Relation Age of Onset  . Hypertension Mother   . Diabetes Mother   . Hypertension Father   . Diabetes Father     Social History Social History   Tobacco Use  . Smoking status: Never Smoker  . Smokeless tobacco: Never Used  Substance Use Topics  . Alcohol use: Yes    Comment: socially  . Drug use: No     Allergies   Latex; Metformin and related; and Augmentin [amoxicillin-pot clavulanate]   Review of  Systems Review of Systems  Constitutional: Negative for chills and fever.  HENT: Positive for congestion, postnasal drip and sore throat. Negative for ear pain, trouble swallowing and voice change.   Respiratory: Positive for cough. Negative for shortness of breath.   Cardiovascular: Negative for chest pain and palpitations.  Gastrointestinal: Negative for abdominal pain, diarrhea, nausea and vomiting.  Musculoskeletal: Negative for arthralgias, back pain and myalgias.  Skin: Negative for rash.     Physical Exam Triage Vital Signs ED Triage Vitals  Enc Vitals Group     BP      Pulse      Resp      Temp      Temp src      SpO2      Weight      Height      Head Circumference      Peak Flow      Pain Score      Pain Loc      Pain Edu?      Excl. in [place]?    No data found.  Updated Vital Signs BP 128/82 (BP Location: Right Arm)   Pulse 89   Temp 98.3 F (36.8 C) (Oral)   Resp 18   Ht 5\' 3"  (1.6 m)   Wt 187 lb (84.8 kg)   SpO2 96%   BMI 33.13 kg/m   Visual Acuity Right Eye Distance:   Left Eye Distance:   Bilateral Distance:    Right Eye Near:   Left Eye Near:    Bilateral Near:     Physical Exam Vitals signs and nursing note reviewed.  Constitutional:      Appearance: She is well-developed.  HENT:     Head: Normocephalic and atraumatic.     Right Ear: Tympanic membrane normal.     Left Ear: Tympanic membrane normal.     Nose: Nose normal.     Right Sinus: No maxillary sinus tenderness or frontal sinus tenderness.     Left Sinus: No maxillary sinus tenderness or frontal sinus tenderness.     Mouth/Throat:     Lips: Pink.     Mouth: Mucous membranes are moist. Oral lesions (white patchy lesions on tongue and pharynx) present.     Pharynx: Uvula midline. Oropharyngeal exudate and posterior oropharyngeal erythema present. No pharyngeal swelling or uvula swelling.  Neck:     Musculoskeletal: Normal range of motion and neck supple.  Cardiovascular:     Rate  and Rhythm: Normal rate and regular rhythm.  Pulmonary:     Effort: Pulmonary effort is normal. No respiratory distress.     Breath sounds: Normal breath sounds. No stridor. No wheezing or  rhonchi.  Musculoskeletal: Normal range of motion.  Lymphadenopathy:     Cervical: No cervical adenopathy.  Skin:    General: Skin is warm and dry.  Neurological:     Mental Status: She is alert and oriented to person, place, and time.  Psychiatric:        Behavior: Behavior normal.      UC Treatments / Results  Labs (all labs ordered are listed, but only abnormal results are displayed) Labs Reviewed  STREP A DNA PROBE  POCT RAPID STREP A (OFFICE)    EKG None  Radiology No results found.  Procedures Procedures (including critical care time)  Medications Ordered in UC Medications - No data to display  Initial Impression / Assessment and Plan / UC Course  I have reviewed the triage vital signs and the nursing notes.  Pertinent labs & imaging results that were available during my care of the patient were reviewed by me and considered in my medical decision making (see chart for details).    Rapid strep: negative Will tx for oral thrush Home care info provided  Final Clinical Impressions(s) / UC Diagnoses   Final diagnoses:  Sore throat  Oral thrush     Discharge Instructions      You may take 500mg  acetaminophen every 4-6 hours or in combination with ibuprofen 400-600mg  every 6-8 hours as needed for pain, inflammation, and fever.  Be sure to well hydrated with clear liquids and get at least 8 hours of sleep at night, preferably more while sick.   Please follow up with family medicine in 1 week if needed.     ED Prescriptions    Medication Sig Dispense Auth. Provider   fluconazole (DIFLUCAN) 200 MG tablet Take 1 tablet (200 mg total) by mouth daily. 7 tablet Noe Gens, PA-C     Controlled Substance Prescriptions [place] Controlled Substance Registry consulted?  Not Applicable   Tyrell Antonio 05/01/18 8563

## 2018-04-27 NOTE — ED Triage Notes (Signed)
Sick since Saturday, tongue whitish.  Sore throat, left ear pain, yesterday a cough, nasal drip.  Denies fever

## 2018-04-29 LAB — STREP A DNA PROBE: Group A Strep Probe: NOT DETECTED

## 2018-04-30 ENCOUNTER — Telehealth: Payer: Self-pay

## 2018-04-30 NOTE — Telephone Encounter (Signed)
Throat and tongue is getting some better.  Given tcx results.  Will follow up as needed.

## 2018-09-20 DIAGNOSIS — G5603 Carpal tunnel syndrome, bilateral upper limbs: Secondary | ICD-10-CM | POA: Diagnosis not present

## 2018-09-20 DIAGNOSIS — Z79899 Other long term (current) drug therapy: Secondary | ICD-10-CM | POA: Diagnosis not present

## 2018-09-20 DIAGNOSIS — M4802 Spinal stenosis, cervical region: Secondary | ICD-10-CM | POA: Diagnosis not present

## 2018-09-20 DIAGNOSIS — B370 Candidal stomatitis: Secondary | ICD-10-CM | POA: Diagnosis not present

## 2018-09-20 DIAGNOSIS — M3501 Sicca syndrome with keratoconjunctivitis: Secondary | ICD-10-CM | POA: Diagnosis not present

## 2018-09-20 DIAGNOSIS — M180 Bilateral primary osteoarthritis of first carpometacarpal joints: Secondary | ICD-10-CM | POA: Diagnosis not present

## 2018-09-20 DIAGNOSIS — M8949 Other hypertrophic osteoarthropathy, multiple sites: Secondary | ICD-10-CM | POA: Diagnosis not present

## 2018-10-10 DIAGNOSIS — Z20828 Contact with and (suspected) exposure to other viral communicable diseases: Secondary | ICD-10-CM | POA: Diagnosis not present

## 2018-10-10 DIAGNOSIS — J069 Acute upper respiratory infection, unspecified: Secondary | ICD-10-CM | POA: Diagnosis not present

## 2018-10-30 DIAGNOSIS — M25531 Pain in right wrist: Secondary | ICD-10-CM | POA: Diagnosis not present

## 2018-10-30 DIAGNOSIS — M180 Bilateral primary osteoarthritis of first carpometacarpal joints: Secondary | ICD-10-CM | POA: Diagnosis not present

## 2018-10-30 DIAGNOSIS — M25641 Stiffness of right hand, not elsewhere classified: Secondary | ICD-10-CM | POA: Diagnosis not present

## 2018-10-30 DIAGNOSIS — M79641 Pain in right hand: Secondary | ICD-10-CM | POA: Diagnosis not present

## 2018-10-30 DIAGNOSIS — M25642 Stiffness of left hand, not elsewhere classified: Secondary | ICD-10-CM | POA: Diagnosis not present

## 2018-10-30 DIAGNOSIS — M25532 Pain in left wrist: Secondary | ICD-10-CM | POA: Diagnosis not present

## 2018-10-30 DIAGNOSIS — M79642 Pain in left hand: Secondary | ICD-10-CM | POA: Diagnosis not present

## 2018-10-30 DIAGNOSIS — G5601 Carpal tunnel syndrome, right upper limb: Secondary | ICD-10-CM | POA: Diagnosis not present

## 2018-10-30 DIAGNOSIS — M654 Radial styloid tenosynovitis [de Quervain]: Secondary | ICD-10-CM | POA: Diagnosis not present

## 2018-11-19 ENCOUNTER — Telehealth: Payer: Self-pay

## 2018-11-19 ENCOUNTER — Encounter: Payer: Self-pay | Admitting: Emergency Medicine

## 2018-11-19 ENCOUNTER — Emergency Department (INDEPENDENT_AMBULATORY_CARE_PROVIDER_SITE_OTHER)
Admission: EM | Admit: 2018-11-19 | Discharge: 2018-11-19 | Disposition: A | Discharge status: Home / Self Care | Payer: Medicaid Other | Source: Home / Self Care

## 2018-11-19 DIAGNOSIS — K029 Dental caries, unspecified: Secondary | ICD-10-CM

## 2018-11-19 DIAGNOSIS — K047 Periapical abscess without sinus: Secondary | ICD-10-CM

## 2018-11-19 MED ORDER — CHLORHEXIDINE GLUCONATE 0.12 % MT SOLN: 15.0000 mL | Freq: Two times a day (BID) | OROMUCOSAL | 0 refills | Status: DC

## 2018-11-19 MED ORDER — CLINDAMYCIN HCL 300 MG PO CAPS: 300.0000 mg | ORAL_CAPSULE | Freq: Three times a day (TID) | ORAL | 0 refills | Status: DC

## 2018-11-19 MED ORDER — FLUCONAZOLE 150 MG PO TABS: 150.0000 mg | ORAL_TABLET | Freq: Once | ORAL | 0 refills | Status: AC

## 2018-11-19 NOTE — ED Triage Notes (Signed)
Patient c/o mouth pain from hole in tooth on the left side.  Patient has been having pain for over 2 weeks, today pain has worsened.  Patient does not have a dentist and has been taking OTC medication for pain.

## 2018-11-19 NOTE — ED Provider Notes (Signed)
[name]    CSN: [id number] Arrival date & time: 11/19/18  1121     History   Chief Complaint Chief Complaint  Patient presents with   Tooth Pain    HPI Sierra Henderson is a 48 y.o. female.   HPI Sierra Henderson is a 48 y.o. female presenting to UC with c/o 2 weeks of gradually worsening bilateral upper back tooth pain due to multiple cavities and a hole in her Left upper tooth.  She has had dental abscesses in the past. She has taken OTC pain medications w/o relief. Denies fever, n/v/d. No trouble swallowing or breathing. She does not have a dentist.    Past Medical History:  Diagnosis Date   Arthritis    Asthma    Cervical spinal stenosis    Diabetes mellitus without complication ([facility])    Low back pain    Nummular dermatitis    Obesity    Ovarian cyst    Sjoegren syndrome    Sleep apnea     There are no active problems to display for this patient.   Past Surgical History:  Procedure Laterality Date   ADENOIDECTOMY     CESAREAN SECTION     CHOLECYSTECTOMY     TONSILLECTOMY      OB History   No obstetric history on file.      Home Medications    Prior to Admission medications   Medication Sig Start Date End Date Taking? Authorizing Provider  albuterol (PROVENTIL) (5 MG/ML) 0.5% nebulizer solution Take 2.5 mg by nebulization every 6 (six) hours as needed for wheezing or shortness of breath.    [provider]  calcium-vitamin D (OSCAL WITH D) 250-125 MG-UNIT tablet Take 1 tablet by mouth daily.    [provider]  chlorhexidine (PERIDEX) 0.12 % solution Use as directed 15 mLs in the mouth or throat 2 (two) times daily. Swish, gargle for 60 seconds, then spit. 11/19/18   Noe Gens, PA-C  clindamycin (CLEOCIN) 300 MG capsule Take 1 capsule (300 mg total) by mouth 3 (three) times daily. X 7 days 11/19/18   Noe Gens, PA-C  fluconazole (DIFLUCAN) 150 MG tablet Take 1 tablet (150 mg total) by mouth once for 1  dose. 11/19/18 11/19/18  Noe Gens, PA-C  gabapentin (NEURONTIN) 300 MG capsule Take 300 mg by mouth 2 (two) times daily.    [provider]  glipiZIDE (GLUCOTROL) 10 MG tablet Take 10 mg by mouth daily before breakfast.    [provider]  hydroxychloroquine (PLAQUENIL) 200 MG tablet Take by mouth 2 (two) times daily.    [provider]  levonorgestrel (MIRENA) 20 MCG/24HR IUD 1 each by Intrauterine route once.    [provider]  meloxicam (MOBIC) 7.5 MG tablet Take 7.5 mg by mouth daily.    [provider]  nystatin (MYCOSTATIN/NYSTOP) powder Apply topically 3 (three) times daily.    [provider]  pilocarpine (SALAGEN) 5 MG tablet Take by mouth 3 (three) times daily.    [provider]  tizanidine (ZANAFLEX) 2 MG capsule Take 2 mg by mouth 3 (three) times daily.    [provider]  triamcinolone ointment (KENALOG) 0.5 % Apply 1 application topically 2 (two) times daily.    [provider]    Family History Family History  Problem Relation Age of Onset   Hypertension Mother    Diabetes Mother    Hypertension Father    Diabetes Father  Social History Social History   Tobacco Use   Smoking status: Never Smoker   Smokeless tobacco: Never Used  Substance Use Topics   Alcohol use: Yes    Comment: socially   Drug use: No     Allergies   Latex, Metformin and related, and Augmentin [amoxicillin-pot clavulanate]   Review of Systems Review of Systems  Constitutional: Negative for chills and fever.  HENT: Positive for dental problem and facial swelling (mild left side).   Gastrointestinal: Negative for diarrhea, nausea and vomiting.     Physical Exam Triage Vital Signs ED Triage Vitals  Enc Vitals Group     BP 11/19/18 1144 103/70     Pulse Rate 11/19/18 1144 100     Resp --      Temp 11/19/18 1144 98.7 F (37.1 C)     Temp Source 11/19/18 1144 Oral     SpO2 11/19/18 1144  98 %     Weight 11/19/18 1146 186 lb 6 oz (84.5 kg)     Height 11/19/18 1146 5\' 3"  (1.6 m)     Head Circumference --      Peak Flow --      Pain Score 11/19/18 1146 10     Pain Loc --      Pain Edu? --      Excl. in [place]? --    No data found.  Updated Vital Signs BP 103/70 (BP Location: Left Arm)    Pulse 100    Temp 98.7 F (37.1 C) (Oral)    Ht 5\' 3"  (1.6 m)    Wt 186 lb 6 oz (84.5 kg)    SpO2 98%    BMI 33.01 kg/m     Physical Exam Vitals signs and nursing note reviewed.  Constitutional:      Appearance: Normal appearance. She is well-developed.  HENT:     Head: Normocephalic and atraumatic.     Nose: Nose normal.     Mouth/Throat:     Lips: Pink.     Mouth: Mucous membranes are moist.     Dentition: Abnormal dentition. Dental tenderness and dental caries present.     Pharynx: Oropharynx is clear. Uvula midline.      Comments: Diffuse dental decay. Tenderness to upper back gingiva.  Neck:     Musculoskeletal: Normal range of motion.  Cardiovascular:     Rate and Rhythm: Normal rate and regular rhythm.  Pulmonary:     Effort: Pulmonary effort is normal.  Musculoskeletal: Normal range of motion.  Skin:    General: Skin is warm and dry.  Neurological:     Mental Status: She is alert and oriented to person, place, and time.  Psychiatric:        Behavior: Behavior normal.      UC Treatments / Results  Labs (all labs ordered are listed, but only abnormal results are displayed) Labs Reviewed - No data to display  EKG   Radiology No results found.  Procedures Procedures (including critical care time)  Medications Ordered in UC Medications - No data to display  Initial Impression / Assessment and Plan / UC Course  I have reviewed the triage vital signs and the nursing notes.  Pertinent labs & imaging results that were available during my care of the patient were reviewed by me and considered in my medical decision making (see chart for details).      Will start pt on antibiotics for suspected dental abscess. Pt notes she has done  well with chlorhexidine mouthwash in the past.  Multiple resources for dentists provided.  Final Clinical Impressions(s) / UC Diagnoses   Final diagnoses:  Dental decay  Dental abscess     Discharge Instructions      Patients with Medicaid: [place] [address] [place], [place] [address], [phone]  If unable to pay, or uninsured, contact [facility] [phone]) or [place] [phone] in [place], [place] in [facility]) to become qualified for the adult dental clinic  Other [place], [place], [place], [address]    [phone], Ext. [phone]    2nd and 4th Thursday of the month at 6:30am    10 clients each day by appointment, can sometimes see walk-in patients if someone does not show for an appointment [facility]- [address] [place], [place], [address]    [phone] [facility]- [address], [place], [place], [address]    [phone]  [facility]- [phone] [facility] [phone]     ED Prescriptions    Medication Sig Dispense Auth. Provider   chlorhexidine (PERIDEX) 0.12 % solution Use as directed 15 mLs in the mouth or throat 2 (two) times daily. Swish, gargle for 60 seconds, then spit. 120 mL Gerarda Fraction, Shayli Altemose O, PA-C   clindamycin (CLEOCIN) 300 MG capsule Take 1 capsule (300 mg total) by mouth 3 (three) times daily. X 7 days 21 capsule Noe Gens, [place]     Controlled Substance Prescriptions [place] Controlled Substance Registry consulted? Not Applicable   Tyrell Antonio 11/19/18 1548

## 2018-11-19 NOTE — Telephone Encounter (Signed)
Pt called requesting pain medication, per E phelps, we will not prescribe pain medication.  Also gets yeast infection with antibiotic use, sent in diflucan per E Phelps.

## 2018-11-19 NOTE — Discharge Instructions (Signed)
°  Patients with Medicaid: [facility] [id number] [address], [place] [address], [phone]  If unable to pay, or uninsured, contact [facility] [phone]) or [place] [phone] in [place], [place] in [facility]) to become qualified for the adult dental clinic  Other [place], [place], [place], [address]    [phone], Ext. [phone]    2nd and 4th Thursday of the month at 6:30am    10 clients each day by appointment, can sometimes see walk-in patients if someone does not show for an appointment [place], [place], [place], [address]    [phone] [facility]- [address], [place], [place], [address]    [phone]  [place] Department- [phone] [place] [facility] Department- [phone]

## 2018-11-24 DIAGNOSIS — B373 Candidiasis of vulva and vagina: Secondary | ICD-10-CM | POA: Diagnosis not present

## 2018-11-24 DIAGNOSIS — T8332XA Displacement of intrauterine contraceptive device, initial encounter: Secondary | ICD-10-CM | POA: Diagnosis not present

## 2018-11-24 DIAGNOSIS — N898 Other specified noninflammatory disorders of vagina: Secondary | ICD-10-CM | POA: Diagnosis not present

## 2018-11-24 DIAGNOSIS — D259 Leiomyoma of uterus, unspecified: Secondary | ICD-10-CM | POA: Diagnosis not present

## 2018-12-04 DIAGNOSIS — Z30431 Encounter for routine checking of intrauterine contraceptive device: Secondary | ICD-10-CM | POA: Diagnosis not present

## 2018-12-04 DIAGNOSIS — D219 Benign neoplasm of connective and other soft tissue, unspecified: Secondary | ICD-10-CM | POA: Diagnosis not present

## 2018-12-04 DIAGNOSIS — N920 Excessive and frequent menstruation with regular cycle: Secondary | ICD-10-CM | POA: Diagnosis not present

## 2018-12-04 DIAGNOSIS — T8332XD Displacement of intrauterine contraceptive device, subsequent encounter: Secondary | ICD-10-CM | POA: Diagnosis not present

## 2018-12-04 DIAGNOSIS — Z309 Encounter for contraceptive management, unspecified: Secondary | ICD-10-CM | POA: Diagnosis not present

## 2018-12-04 DIAGNOSIS — D259 Leiomyoma of uterus, unspecified: Secondary | ICD-10-CM | POA: Diagnosis not present

## 2019-01-05 DIAGNOSIS — M25642 Stiffness of left hand, not elsewhere classified: Secondary | ICD-10-CM | POA: Diagnosis not present

## 2019-01-05 DIAGNOSIS — M25531 Pain in right wrist: Secondary | ICD-10-CM | POA: Diagnosis not present

## 2019-01-05 DIAGNOSIS — M7712 Lateral epicondylitis, left elbow: Secondary | ICD-10-CM | POA: Diagnosis not present

## 2019-01-05 DIAGNOSIS — M180 Bilateral primary osteoarthritis of first carpometacarpal joints: Secondary | ICD-10-CM | POA: Diagnosis not present

## 2019-01-05 DIAGNOSIS — M79641 Pain in right hand: Secondary | ICD-10-CM | POA: Diagnosis not present

## 2019-01-05 DIAGNOSIS — M25532 Pain in left wrist: Secondary | ICD-10-CM | POA: Diagnosis not present

## 2019-01-05 DIAGNOSIS — M79642 Pain in left hand: Secondary | ICD-10-CM | POA: Diagnosis not present

## 2019-01-05 DIAGNOSIS — M7711 Lateral epicondylitis, right elbow: Secondary | ICD-10-CM | POA: Diagnosis not present

## 2019-01-05 DIAGNOSIS — M654 Radial styloid tenosynovitis [de Quervain]: Secondary | ICD-10-CM | POA: Diagnosis not present

## 2019-01-08 DIAGNOSIS — H04123 Dry eye syndrome of bilateral lacrimal glands: Secondary | ICD-10-CM | POA: Diagnosis not present

## 2019-01-08 DIAGNOSIS — H527 Unspecified disorder of refraction: Secondary | ICD-10-CM | POA: Diagnosis not present

## 2019-01-08 DIAGNOSIS — E119 Type 2 diabetes mellitus without complications: Secondary | ICD-10-CM | POA: Diagnosis not present

## 2019-01-09 DIAGNOSIS — H5213 Myopia, bilateral: Secondary | ICD-10-CM | POA: Diagnosis not present

## 2019-01-22 DIAGNOSIS — Z01812 Encounter for preprocedural laboratory examination: Secondary | ICD-10-CM | POA: Diagnosis not present

## 2019-01-22 DIAGNOSIS — Z30433 Encounter for removal and reinsertion of intrauterine contraceptive device: Secondary | ICD-10-CM | POA: Diagnosis not present

## 2019-03-02 DIAGNOSIS — M79642 Pain in left hand: Secondary | ICD-10-CM | POA: Diagnosis not present

## 2019-03-02 DIAGNOSIS — M654 Radial styloid tenosynovitis [de Quervain]: Secondary | ICD-10-CM | POA: Diagnosis not present

## 2019-03-02 DIAGNOSIS — M79641 Pain in right hand: Secondary | ICD-10-CM | POA: Diagnosis not present

## 2019-03-02 DIAGNOSIS — M25531 Pain in right wrist: Secondary | ICD-10-CM | POA: Diagnosis not present

## 2019-03-02 DIAGNOSIS — M25532 Pain in left wrist: Secondary | ICD-10-CM | POA: Diagnosis not present

## 2019-03-02 DIAGNOSIS — M25642 Stiffness of left hand, not elsewhere classified: Secondary | ICD-10-CM | POA: Diagnosis not present

## 2019-03-02 DIAGNOSIS — M180 Bilateral primary osteoarthritis of first carpometacarpal joints: Secondary | ICD-10-CM | POA: Diagnosis not present

## 2019-03-06 DIAGNOSIS — Z30431 Encounter for routine checking of intrauterine contraceptive device: Secondary | ICD-10-CM | POA: Diagnosis not present

## 2019-03-06 DIAGNOSIS — Z975 Presence of (intrauterine) contraceptive device: Secondary | ICD-10-CM | POA: Diagnosis not present

## 2019-03-06 DIAGNOSIS — Z309 Encounter for contraceptive management, unspecified: Secondary | ICD-10-CM | POA: Diagnosis not present

## 2019-03-06 DIAGNOSIS — N3090 Cystitis, unspecified without hematuria: Secondary | ICD-10-CM | POA: Diagnosis not present

## 2019-03-06 DIAGNOSIS — N760 Acute vaginitis: Secondary | ICD-10-CM | POA: Diagnosis not present

## 2019-03-06 DIAGNOSIS — R300 Dysuria: Secondary | ICD-10-CM | POA: Diagnosis not present

## 2019-03-06 DIAGNOSIS — R3989 Other symptoms and signs involving the genitourinary system: Secondary | ICD-10-CM | POA: Diagnosis not present

## 2019-03-14 DIAGNOSIS — H52223 Regular astigmatism, bilateral: Secondary | ICD-10-CM | POA: Diagnosis not present

## 2019-04-01 ENCOUNTER — Emergency Department (INDEPENDENT_AMBULATORY_CARE_PROVIDER_SITE_OTHER)
Admission: EM | Admit: 2019-04-01 | Discharge: 2019-04-01 | Disposition: A | Discharge status: Home / Self Care | Payer: Medicaid Other | Source: Home / Self Care | Attending: Family Medicine | Admitting: Family Medicine

## 2019-04-01 ENCOUNTER — Encounter: Payer: Self-pay | Admitting: Emergency Medicine

## 2019-04-01 ENCOUNTER — Other Ambulatory Visit: Payer: Self-pay

## 2019-04-01 DIAGNOSIS — B9789 Other viral agents as the cause of diseases classified elsewhere: Secondary | ICD-10-CM | POA: Diagnosis not present

## 2019-04-01 DIAGNOSIS — J0101 Acute recurrent maxillary sinusitis: Secondary | ICD-10-CM | POA: Diagnosis not present

## 2019-04-01 DIAGNOSIS — J069 Acute upper respiratory infection, unspecified: Secondary | ICD-10-CM | POA: Diagnosis not present

## 2019-04-01 DIAGNOSIS — J029 Acute pharyngitis, unspecified: Secondary | ICD-10-CM

## 2019-04-01 DIAGNOSIS — Z20828 Contact with and (suspected) exposure to other viral communicable diseases: Secondary | ICD-10-CM | POA: Diagnosis not present

## 2019-04-01 DIAGNOSIS — R197 Diarrhea, unspecified: Secondary | ICD-10-CM | POA: Diagnosis not present

## 2019-04-01 LAB — POC SARS CORONAVIRUS 2 AG -  ED: SARS Coronavirus 2 Ag: NEGATIVE

## 2019-04-01 MED ORDER — BENZONATATE 200 MG PO CAPS: ORAL_CAPSULE | ORAL | 0 refills | Status: DC

## 2019-04-01 MED ORDER — ALBUTEROL SULFATE HFA 108 (90 BASE) MCG/ACT IN AERS: 2.0000 | INHALATION_SPRAY | RESPIRATORY_TRACT | 0 refills | Status: DC | PRN

## 2019-04-01 MED ORDER — FLUTICASONE PROPIONATE 50 MCG/ACT NA SUSP: 2.0000 | Freq: Every day | NASAL | 0 refills | Status: DC

## 2019-04-01 MED ORDER — PREDNISONE 20 MG PO TABS: ORAL_TABLET | ORAL | 0 refills | Status: DC

## 2019-04-01 MED ORDER — AMOXICILLIN 875 MG PO TABS: 875.0000 mg | ORAL_TABLET | Freq: Two times a day (BID) | ORAL | 0 refills | Status: DC

## 2019-04-01 NOTE — ED Provider Notes (Signed)
[name]    CSN: [id number] Arrival date & time: 04/01/19  1133      History   Chief Complaint Chief Complaint  Patient presents with  . Headache  . Dizziness  . Chills  . Diarrhea  . Facial Pain    HPI Sierra Henderson is a 48 y.o. female.   During the past three days patient has developed headache, dizziness, fatigue, sinus congestion, and mild sore throat.  Today she has had three episodes of loose stools.   She denies chest tightness, shortness of breath, and changes in taste/smell.  She has a history of asthma.    The history is provided by the patient.    Past Medical History:  Diagnosis Date  . Arthritis   . Asthma   . Cervical spinal stenosis   . Diabetes mellitus without complication ([place])   . Low back pain   . Nummular dermatitis   . Obesity   . Ovarian cyst   . Sjoegren syndrome   . Sleep apnea     There are no active problems to display for this patient.   Past Surgical History:  Procedure Laterality Date  . ADENOIDECTOMY    . CESAREAN SECTION    . CHOLECYSTECTOMY    . TONSILLECTOMY      OB History   No obstetric history on file.      Home Medications    Prior to Admission medications   Medication Sig Start Date End Date Taking? Authorizing Provider  albuterol (PROVENTIL) (5 MG/ML) 0.5% nebulizer solution Take 2.5 mg by nebulization every 6 (six) hours as needed for wheezing or shortness of breath.    [provider]  albuterol (VENTOLIN HFA) 108 (90 Base) MCG/ACT inhaler Inhale 2 puffs into the lungs every 4 (four) hours as needed for wheezing or shortness of breath. 04/01/19   Kandra Nicolas, MD  amoxicillin (AMOXIL) 875 MG tablet Take 1 tablet (875 mg total) by mouth 2 (two) times daily. 04/01/19   Kandra Nicolas, MD  benzonatate (TESSALON) 200 MG capsule Take one cap by mouth at bedtime as needed for cough.  May repeat in 4 to 6 hours 04/01/19   Kandra Nicolas, MD  calcium-vitamin D (OSCAL WITH D) 250-125  MG-UNIT tablet Take 1 tablet by mouth daily.    [provider]  chlorhexidine (PERIDEX) 0.12 % solution Use as directed 15 mLs in the mouth or throat 2 (two) times daily. Swish, gargle for 60 seconds, then spit. 11/19/18   Noe Gens, PA-C  clindamycin (CLEOCIN) 300 MG capsule Take 1 capsule (300 mg total) by mouth 3 (three) times daily. X 7 days 11/19/18   Noe Gens, PA-C  fluticasone [facility]) 50 MCG/ACT nasal spray Place 2 sprays into both nostrils daily. 04/01/19 03/31/20  Kandra Nicolas, MD  gabapentin (NEURONTIN) 300 MG capsule Take 300 mg by mouth 2 (two) times daily.    [provider]  glipiZIDE (GLUCOTROL) 10 MG tablet Take 10 mg by mouth daily before breakfast.    [provider]  hydroxychloroquine (PLAQUENIL) 200 MG tablet Take by mouth 2 (two) times daily.    [provider]  levonorgestrel (MIRENA) 20 MCG/24HR IUD 1 each by Intrauterine route once.    [provider]  meloxicam (MOBIC) 7.5 MG tablet Take 7.5 mg by mouth daily.    [provider]  nystatin (MYCOSTATIN/NYSTOP) powder Apply topically 3 (three) times daily.    [provider]  pilocarpine [facility])  5 MG tablet Take by mouth 3 (three) times daily.    [provider]  predniSONE (DELTASONE) 20 MG tablet Take one tab by mouth twice daily for 4 days, then one daily. Take with food. 04/01/19   Kandra Nicolas, MD  tizanidine (ZANAFLEX) 2 MG capsule Take 2 mg by mouth 3 (three) times daily.    [provider]  triamcinolone ointment (KENALOG) 0.5 % Apply 1 application topically 2 (two) times daily.    [provider]    Family History Family History  Problem Relation Age of Onset  . Hypertension Mother   . Diabetes Mother   . Hypertension Father   . Diabetes Father     Social History Social History   Tobacco Use  . Smoking status: Never Smoker  . Smokeless tobacco: Never Used  Substance Use Topics  . Alcohol use: Yes     Comment: socially  . Drug use: No     Allergies   Latex, Metformin and related, and Augmentin [amoxicillin-pot clavulanate]   Review of Systems Review of Systems No sore throat No cough No pleuritic pain No wheezing + nasal congestion + post-nasal drainage + sinus pain/pressure No itchy/red eyes No earache + dizzy No hemoptysis + SOB No fever/chills No nausea No vomiting No abdominal pain + diarrhea No urinary symptoms No skin rash + fatigue No myalgias + headache    Physical Exam Triage Vital Signs ED Triage Vitals  Enc Vitals Group     BP 04/01/19 1251 125/79     Pulse Rate 04/01/19 1251 96     Resp 04/01/19 1251 16     Temp 04/01/19 1251 98.4 F (36.9 C)     Temp src --      SpO2 04/01/19 1251 98 %     Weight 04/01/19 1252 183 lb (83 kg)     Height 04/01/19 1252 _0  (1.6 m)     Head Circumference --      Peak Flow --      Pain Score 04/01/19 1251 2     Pain Loc --      Pain Edu? --      Excl. in [place]? --    No data found.  Updated Vital Signs BP 125/79 (BP Location: Right Arm)   Pulse 96   Temp 98.4 F (36.9 C)   Resp 16   Ht _1  (1.6 m)   Wt 83 kg   SpO2 98%   BMI 32.42 kg/m   Visual Acuity Right Eye Distance:   Left Eye Distance:   Bilateral Distance:    Right Eye Near:   Left Eye Near:    Bilateral Near:     Physical Exam Nursing notes and Vital Signs reviewed. Appearance:  Patient appears stated age, and in no acute distress Eyes:  Pupils are equal, round, and reactive to light and accomodation.  Extraocular movement is intact.  Conjunctivae are not inflamed  Ears:  Canals normal.  Tympanic membranes normal.  Nose:  Mildly congested turbinates.  Maxillary sinus tenderness is present.  Pharynx:  Normal Neck:  Supple.  Mildly enlarged lateral nodes are present, tender to palpation on the left.   Lungs:  Clear to auscultation.  Breath sounds are equal.  Moving air well. Heart:  Regular rate and rhythm without murmurs,  rubs, or gallops.  Abdomen:  Nontender without masses or hepatosplenomegaly.  Bowel sounds are present.  No CVA or flank tenderness.  Extremities:  No edema.  Skin:  No rash present.   UC Treatments / Results  Labs (all labs ordered are listed, but only abnormal results are displayed) Labs Reviewed  SARS-COV-2 RNA,(COVID-19) QUALITATIVE NAAT  POC SARS CORONAVIRUS 2 AG -  ED negative    EKG   Radiology No results found.  Procedures Procedures (including critical care time)  Medications Ordered in UC Medications - No data to display  Initial Impression / Assessment and Plan / UC Course  I have reviewed the triage vital signs and the nursing notes.  Pertinent labs & imaging results that were available during my care of the patient were reviewed by me and considered in my medical decision making (see chart for details).    Begin empiric amoxicillin, and prednisone burst/taper. Prescription written for Benzonatate [facility]) to take at bedtime for night-time cough.  Refill albuterol inhaler.  Rx for Flonase. COVID19 send out   Final Clinical Impressions(s) / UC Diagnoses   Final diagnoses:  Viral URI  Acute recurrent maxillary sinusitis     Discharge Instructions     Take plain guaifenesin (1252m extended release tabs such as Mucinex) twice daily, with plenty of water, for cough and congestion.  May add Pseudoephedrine (381m one or two every 4 to 6 hours) for sinus congestion.  Get adequate rest.   May use Afrin nasal spray (or generic oxymetazoline) each morning for about 5 days and then discontinue.  Also recommend using saline nasal spray several times daily and saline nasal irrigation (AYR is a common brand).  Use Flonase nasal spray each morning after using Afrin nasal spray and saline nasal irrigation. Try warm salt water gargles for sore throat.  Stop all antihistamines for now, and other non-prescription cough/cold preparations.  Isolate yourself until COVID-19  test result is available.   If COVID-19 test is positive, isolate yourself until the below conditions are met: 1)  At least 7 days since symptoms onset. AND 2)  > 72 hours after symptom resolution (absence of fever without the use of fever-reducing medicine, and improvement in respiratory symptoms.  If symptoms become significantly worse during the night or over the weekend, proceed to the local emergency room.     ED Prescriptions    Medication Sig Dispense Auth. Provider   amoxicillin (AMOXIL) 875 MG tablet Take 1 tablet (875 mg total) by mouth 2 (two) times daily. 14 tablet BeKandra NicolasMD   predniSONE (DELTASONE) 20 MG tablet Take one tab by mouth twice daily for 4 days, then one daily. Take with food. 12 tablet BeKandra NicolasMD   benzonatate (TESSALON) 200 MG capsule Take one cap by mouth at bedtime as needed for cough.  May repeat in 4 to 6 hours 15 capsule BeKandra NicolasMD   albuterol (VENTOLIN HFA) 108 (90 Base) MCG/ACT inhaler Inhale 2 puffs into the lungs every 4 (four) hours as needed for wheezing or shortness of breath. 8 g BeKandra NicolasMD   fluticasone (FLONASE) 50 MCG/ACT nasal spray Place 2 sprays into both nostrils daily. 9.9 mL BeKandra NicolasMD        BeKandra NicolasMD 04/06/19 [phone]

## 2019-04-01 NOTE — ED Triage Notes (Signed)
For past 2 days patient reports occasional dizziness, headache, shortness of breath, diarrhea, and face hurts. No known contact with covid positive person.  Has not had influenza vacc this season.

## 2019-04-01 NOTE — Discharge Instructions (Addendum)
Take plain guaifenesin (124m extended release tabs such as Mucinex) twice daily, with plenty of water, for cough and congestion.  May add Pseudoephedrine (380m one or two every 4 to 6 hours) for sinus congestion.  Get adequate rest.   May use Afrin nasal spray (or generic oxymetazoline) each morning for about 5 days and then discontinue.  Also recommend using saline nasal spray several times daily and saline nasal irrigation (AYR is a common brand).  Use Flonase nasal spray each morning after using Afrin nasal spray and saline nasal irrigation. Try warm salt water gargles for sore throat.  Stop all antihistamines for now, and other non-prescription cough/cold preparations.  Isolate yourself until COVID-19 test result is available.   If COVID-19 test is positive, isolate yourself until the below conditions are met: 1)  At least 7 days since symptoms onset. AND 2)  > 72 hours after symptom resolution (absence of fever without the use of fever-reducing medicine, and improvement in respiratory symptoms.  If symptoms become significantly worse during the night or over the weekend, proceed to the local emergency room.

## 2019-04-03 LAB — SARS-COV-2 RNA,(COVID-19) QUALITATIVE NAAT: SARS CoV2 RNA: NOT DETECTED

## 2019-04-09 DIAGNOSIS — R509 Fever, unspecified: Secondary | ICD-10-CM | POA: Diagnosis not present

## 2019-04-09 DIAGNOSIS — R519 Headache, unspecified: Secondary | ICD-10-CM | POA: Diagnosis not present

## 2019-04-09 DIAGNOSIS — Z1159 Encounter for screening for other viral diseases: Secondary | ICD-10-CM | POA: Diagnosis not present

## 2019-04-09 DIAGNOSIS — R52 Pain, unspecified: Secondary | ICD-10-CM | POA: Diagnosis not present

## 2019-04-09 DIAGNOSIS — R6883 Chills (without fever): Secondary | ICD-10-CM | POA: Diagnosis not present

## 2019-05-14 DIAGNOSIS — Z30431 Encounter for routine checking of intrauterine contraceptive device: Secondary | ICD-10-CM | POA: Diagnosis not present

## 2019-05-14 DIAGNOSIS — N939 Abnormal uterine and vaginal bleeding, unspecified: Secondary | ICD-10-CM | POA: Diagnosis not present

## 2019-05-14 DIAGNOSIS — R1032 Left lower quadrant pain: Secondary | ICD-10-CM | POA: Diagnosis not present

## 2019-05-16 DIAGNOSIS — Z791 Long term (current) use of non-steroidal anti-inflammatories (NSAID): Secondary | ICD-10-CM | POA: Diagnosis not present

## 2019-05-16 DIAGNOSIS — E119 Type 2 diabetes mellitus without complications: Secondary | ICD-10-CM | POA: Diagnosis not present

## 2019-05-16 DIAGNOSIS — Z79899 Other long term (current) drug therapy: Secondary | ICD-10-CM | POA: Diagnosis not present

## 2019-05-16 DIAGNOSIS — Z975 Presence of (intrauterine) contraceptive device: Secondary | ICD-10-CM | POA: Diagnosis not present

## 2019-05-16 DIAGNOSIS — M3500 Sicca syndrome, unspecified: Secondary | ICD-10-CM | POA: Diagnosis not present

## 2019-05-16 DIAGNOSIS — M1990 Unspecified osteoarthritis, unspecified site: Secondary | ICD-10-CM | POA: Diagnosis not present

## 2019-05-16 DIAGNOSIS — Z880 Allergy status to penicillin: Secondary | ICD-10-CM | POA: Diagnosis not present

## 2019-05-16 DIAGNOSIS — Z888 Allergy status to other drugs, medicaments and biological substances status: Secondary | ICD-10-CM | POA: Diagnosis not present

## 2019-05-16 DIAGNOSIS — K219 Gastro-esophageal reflux disease without esophagitis: Secondary | ICD-10-CM | POA: Diagnosis not present

## 2019-05-16 DIAGNOSIS — N938 Other specified abnormal uterine and vaginal bleeding: Secondary | ICD-10-CM | POA: Diagnosis not present

## 2019-05-16 DIAGNOSIS — D259 Leiomyoma of uterus, unspecified: Secondary | ICD-10-CM | POA: Diagnosis not present

## 2019-05-16 DIAGNOSIS — Z91040 Latex allergy status: Secondary | ICD-10-CM | POA: Diagnosis not present

## 2019-05-31 DIAGNOSIS — R102 Pelvic and perineal pain: Secondary | ICD-10-CM | POA: Diagnosis not present

## 2019-05-31 DIAGNOSIS — N760 Acute vaginitis: Secondary | ICD-10-CM | POA: Diagnosis not present

## 2019-06-05 DIAGNOSIS — L680 Hirsutism: Secondary | ICD-10-CM | POA: Diagnosis not present

## 2019-06-05 DIAGNOSIS — L304 Erythema intertrigo: Secondary | ICD-10-CM | POA: Diagnosis not present

## 2019-06-05 DIAGNOSIS — L700 Acne vulgaris: Secondary | ICD-10-CM | POA: Diagnosis not present

## 2019-06-05 DIAGNOSIS — L271 Localized skin eruption due to drugs and medicaments taken internally: Secondary | ICD-10-CM | POA: Diagnosis not present

## 2019-06-12 ENCOUNTER — Emergency Department (INDEPENDENT_AMBULATORY_CARE_PROVIDER_SITE_OTHER)
Admission: EM | Admit: 2019-06-12 | Discharge: 2019-06-12 | Disposition: A | Discharge status: Home / Self Care | Payer: Medicaid Other | Source: Home / Self Care

## 2019-06-12 ENCOUNTER — Other Ambulatory Visit: Payer: Self-pay

## 2019-06-12 ENCOUNTER — Encounter: Payer: Self-pay | Admitting: Emergency Medicine

## 2019-06-12 DIAGNOSIS — J0120 Acute ethmoidal sinusitis, unspecified: Secondary | ICD-10-CM

## 2019-06-12 LAB — POC SARS CORONAVIRUS 2 AG -  ED: SARS Coronavirus 2 Ag: NEGATIVE

## 2019-06-12 MED ORDER — FLUCONAZOLE 200 MG PO TABS: 200.0000 mg | ORAL_TABLET | Freq: Every day | ORAL | 0 refills | Status: AC

## 2019-06-12 MED ORDER — PREDNISONE 10 MG PO TABS: 40.0000 mg | ORAL_TABLET | Freq: Every day | ORAL | 0 refills | Status: AC

## 2019-06-12 MED ORDER — DOXYCYCLINE HYCLATE 100 MG PO CAPS: 100.0000 mg | ORAL_CAPSULE | Freq: Two times a day (BID) | ORAL | 0 refills | Status: DC

## 2019-06-12 NOTE — ED Provider Notes (Signed)
[name]    CSN: [id number] Arrival date & time: 06/12/19  1913      History   Chief Complaint Chief Complaint  Patient presents with  . URI    HPI Sierra Henderson is a 49 y.o. female.   49 year old female, with history of arthritis, asthma, diabetes, eczema, Sjogren's syndrome, sleep apnea, presenting today due to cold symptoms.  Patient states that she has had about 4 days of cough, congestion, subjective fever and chills and loss of taste.  She had no chest pain or shortness of breath.  No abdominal pain.  No nausea or vomiting.  There has been some mild diarrhea.  No known recent sick contacts or Covid exposure.  The history is provided by the patient.  URI Presenting symptoms: congestion, cough, fatigue, fever and sore throat   Presenting symptoms: no ear pain, no facial pain and no rhinorrhea   Severity:  Moderate Onset quality:  Gradual Duration:  4 days Timing:  Constant Progression:  Unchanged Chronicity:  New Relieved by:  Nothing Worsened by:  Nothing Ineffective treatments:  None tried Associated symptoms: headaches, myalgias and sinus pain   Associated symptoms: no arthralgias, no neck pain, no sneezing, no swollen glands and no wheezing   Risk factors: diabetes mellitus and sick contacts (grandson )   Risk factors: not elderly, no chronic cardiac disease, no chronic kidney disease, no chronic respiratory disease, no recent illness and no recent travel     Past Medical History:  Diagnosis Date  . Arthritis   . Asthma   . Cervical spinal stenosis   . Diabetes mellitus without complication ([place])   . Low back pain   . Nummular dermatitis   . Obesity   . Ovarian cyst   . Sjoegren syndrome   . Sleep apnea     There are no problems to display for this patient.   Past Surgical History:  Procedure Laterality Date  . ADENOIDECTOMY    . CESAREAN SECTION    . CHOLECYSTECTOMY    . TONSILLECTOMY      OB History   No obstetric history on  file.      Home Medications    Prior to Admission medications   Medication Sig Start Date End Date Taking? Authorizing Provider  albuterol (PROVENTIL) (5 MG/ML) 0.5% nebulizer solution Take 2.5 mg by nebulization every 6 (six) hours as needed for wheezing or shortness of breath.    [provider]  albuterol (VENTOLIN HFA) 108 (90 Base) MCG/ACT inhaler Inhale 2 puffs into the lungs every 4 (four) hours as needed for wheezing or shortness of breath. 04/01/19   Kandra Nicolas, MD  amoxicillin (AMOXIL) 875 MG tablet Take 1 tablet (875 mg total) by mouth 2 (two) times daily. 04/01/19   Kandra Nicolas, MD  benzonatate (TESSALON) 200 MG capsule Take one cap by mouth at bedtime as needed for cough.  May repeat in 4 to 6 hours 04/01/19   Kandra Nicolas, MD  calcium-vitamin D (OSCAL WITH D) 250-125 MG-UNIT tablet Take 1 tablet by mouth daily.    [provider]  chlorhexidine (PERIDEX) 0.12 % solution Use as directed 15 mLs in the mouth or throat 2 (two) times daily. Swish, gargle for 60 seconds, then spit. 11/19/18   Noe Gens, PA-C  clindamycin (CLEOCIN) 300 MG capsule Take 1 capsule (300 mg total) by mouth 3 (three) times daily. X 7 days 11/19/18   Noe Gens, PA-C  doxycycline (VIBRAMYCIN) 100  MG capsule Take 1 capsule (100 mg total) by mouth 2 (two) times daily. 06/12/19   Wasil Wolke C, PA-C  fluticasone (FLONASE) 50 MCG/ACT nasal spray Place 2 sprays into both nostrils daily. 04/01/19 03/31/20  Kandra Nicolas, MD  gabapentin (NEURONTIN) 300 MG capsule Take 300 mg by mouth 2 (two) times daily.    [provider]  glipiZIDE (GLUCOTROL) 10 MG tablet Take 10 mg by mouth daily before breakfast.    [provider]  hydroxychloroquine (PLAQUENIL) 200 MG tablet Take by mouth 2 (two) times daily.    [provider]  levonorgestrel (MIRENA) 20 MCG/24HR IUD 1 each by Intrauterine route once.    [provider]  meloxicam (MOBIC) 7.5 MG tablet  Take 7.5 mg by mouth daily.    [provider]  nystatin (MYCOSTATIN/NYSTOP) powder Apply topically 3 (three) times daily.    [provider]  pilocarpine (SALAGEN) 5 MG tablet Take by mouth 3 (three) times daily.    [provider]  predniSONE (DELTASONE) 10 MG tablet Take 4 tablets (40 mg total) by mouth daily for 5 days. 06/12/19 06/17/19  Abbigaile Rockman C, PA-C  tizanidine (ZANAFLEX) 2 MG capsule Take 2 mg by mouth 3 (three) times daily.    [provider]  triamcinolone ointment (KENALOG) 0.5 % Apply 1 application topically 2 (two) times daily.    [provider]    Family History Family History  Problem Relation Age of Onset  . Hypertension Mother   . Diabetes Mother   . Hypertension Father   . Diabetes Father     Social History Social History   Tobacco Use  . Smoking status: Never Smoker  . Smokeless tobacco: Never Used  Substance Use Topics  . Alcohol use: Yes    Comment: socially  . Drug use: No     Allergies   Latex, Metformin and related, and Augmentin [amoxicillin-pot clavulanate]   Review of Systems Review of Systems  Constitutional: Positive for chills, fatigue and fever.  HENT: Positive for congestion, sinus pain and sore throat. Negative for ear pain, rhinorrhea and sneezing.        Loss of taste    Eyes: Negative for pain and visual disturbance.  Respiratory: Positive for cough. Negative for shortness of breath and wheezing.   Cardiovascular: Negative for chest pain and palpitations.  Gastrointestinal: Positive for diarrhea. Negative for abdominal pain and vomiting.  Genitourinary: Negative for dysuria and hematuria.  Musculoskeletal: Positive for myalgias. Negative for arthralgias, back pain and neck pain.  Skin: Negative for color change and rash.  Neurological: Positive for headaches. Negative for seizures and syncope.  All other systems reviewed and are negative.    Physical Exam Triage Vital Signs ED  Triage Vitals  Enc Vitals Group     BP      Pulse      Resp      Temp      Temp src      SpO2      Weight      Height      Head Circumference      Peak Flow      Pain Score      Pain Loc      Pain Edu?      Excl. in [place]?    No data found.  Updated Vital Signs BP 130/81 (BP Location: Left Arm)   Pulse 92   Temp 99.2 F (37.3 C) (Oral)   Ht 5'  3" (1.6 m)   Wt 185 lb (83.9 kg)   SpO2 98%   BMI 32.77 kg/m   Visual Acuity Right Eye Distance:   Left Eye Distance:   Bilateral Distance:    Right Eye Near:   Left Eye Near:    Bilateral Near:     Physical Exam Vitals and nursing note reviewed.  Constitutional:      General: She is not in acute distress.    Appearance: She is well-developed.  HENT:     Head: Normocephalic and atraumatic.     Right Ear: A middle ear effusion is present.     Left Ear: A middle ear effusion is present.     Mouth/Throat:     Pharynx: Posterior oropharyngeal erythema present.  Eyes:     Conjunctiva/sclera: Conjunctivae normal.  Cardiovascular:     Rate and Rhythm: Normal rate and regular rhythm.     Heart sounds: No murmur.  Pulmonary:     Effort: Pulmonary effort is normal. No respiratory distress.     Breath sounds: Normal breath sounds.  Abdominal:     Palpations: Abdomen is soft.     Tenderness: There is no abdominal tenderness.  Musculoskeletal:     Cervical back: Neck supple.  Skin:    General: Skin is warm and dry.  Neurological:     Mental Status: She is alert.      UC Treatments / Results  Labs (all labs ordered are listed, but only abnormal results are displayed) Labs Reviewed  SARS-COV-2 RNA,(COVID-19) QUALITATIVE NAAT  POC SARS CORONAVIRUS 2 AG -  ED    EKG   Radiology No results found.  Procedures Procedures (including critical care time)  Medications Ordered in UC Medications - No data to display  Initial Impression / Assessment and Plan / UC Course  I have reviewed the triage vital signs and  the nursing notes.  Pertinent labs & imaging results that were available during my care of the patient were reviewed by me and considered in my medical decision making (see chart for details).     4 days of URI symptoms. Rapid covid negative. Send out pending. Will treat for sinusitis Final Clinical Impressions(s) / UC Diagnoses   Final diagnoses:  Acute non-recurrent ethmoidal sinusitis   Discharge Instructions   None    ED Prescriptions    Medication Sig Dispense Auth. Provider   predniSONE (DELTASONE) 10 MG tablet Take 4 tablets (40 mg total) by mouth daily for 5 days. 20 tablet Joniya Boberg C, PA-C   doxycycline (VIBRAMYCIN) 100 MG capsule Take 1 capsule (100 mg total) by mouth 2 (two) times daily. 20 capsule Suheyla Mortellaro C, PA-C     PDMP not reviewed this encounter.   Phebe Colla, [place] 06/12/19 2007

## 2019-06-12 NOTE — ED Triage Notes (Signed)
Patient c/o dizzy, no taste x 2 days, diarrhea, runny nose, no fever.

## 2019-06-15 LAB — SARS-COV-2 RNA,(COVID-19) QUALITATIVE NAAT: SARS CoV2 RNA: NOT DETECTED

## 2019-07-23 ENCOUNTER — Encounter: Payer: Self-pay | Admitting: Emergency Medicine

## 2019-07-23 ENCOUNTER — Other Ambulatory Visit (HOSPITAL_COMMUNITY)
Admission: RE | Admit: 2019-07-23 | Discharge: 2019-07-23 | Disposition: A | Discharge status: Home / Self Care | Payer: Medicaid Other | Source: Ambulatory Visit | Attending: Family Medicine | Admitting: Family Medicine

## 2019-07-23 ENCOUNTER — Other Ambulatory Visit: Payer: Self-pay

## 2019-07-23 ENCOUNTER — Emergency Department (INDEPENDENT_AMBULATORY_CARE_PROVIDER_SITE_OTHER): Payer: Medicaid Other

## 2019-07-23 ENCOUNTER — Emergency Department (INDEPENDENT_AMBULATORY_CARE_PROVIDER_SITE_OTHER)
Admission: EM | Admit: 2019-07-23 | Discharge: 2019-07-23 | Disposition: A | Discharge status: Other Acute Inpatient Hospital | Payer: Medicaid Other | Source: Home / Self Care

## 2019-07-23 DIAGNOSIS — R52 Pain, unspecified: Secondary | ICD-10-CM

## 2019-07-23 DIAGNOSIS — R05 Cough: Secondary | ICD-10-CM | POA: Diagnosis not present

## 2019-07-23 DIAGNOSIS — Z20822 Contact with and (suspected) exposure to covid-19: Secondary | ICD-10-CM | POA: Insufficient documentation

## 2019-07-23 DIAGNOSIS — R3989 Other symptoms and signs involving the genitourinary system: Secondary | ICD-10-CM | POA: Diagnosis not present

## 2019-07-23 DIAGNOSIS — J069 Acute upper respiratory infection, unspecified: Secondary | ICD-10-CM | POA: Diagnosis not present

## 2019-07-23 DIAGNOSIS — N760 Acute vaginitis: Secondary | ICD-10-CM

## 2019-07-23 DIAGNOSIS — R0602 Shortness of breath: Secondary | ICD-10-CM

## 2019-07-23 DIAGNOSIS — E08649 Diabetes mellitus due to underlying condition with hypoglycemia without coma: Secondary | ICD-10-CM

## 2019-07-23 LAB — POCT URINALYSIS DIP (MANUAL ENTRY)
Bilirubin, UA: NEGATIVE
Glucose, UA: 500 mg/dL — AB
Ketones, POC UA: NEGATIVE mg/dL
Leukocytes, UA: NEGATIVE
Nitrite, UA: NEGATIVE
Protein Ur, POC: NEGATIVE mg/dL
Spec Grav, UA: 1.01 (ref 1.010–1.025)
Urobilinogen, UA: 0.2 E.U./dL
pH, UA: 5.5 (ref 5.0–8.0)

## 2019-07-23 LAB — POCT FASTING CBG KUC MANUAL ENTRY: POCT Glucose (KUC): 491 mg/dL — AB (ref 70–99)

## 2019-07-23 MED ORDER — BENZONATATE 100 MG PO CAPS: 100.0000 mg | ORAL_CAPSULE | Freq: Three times a day (TID) | ORAL | 0 refills | Status: DC

## 2019-07-23 NOTE — Discharge Instructions (Addendum)
Your blood sugar is 491.  This is a dangerous level, especially with your reported symptoms of fatigue and shortness of breath. It is advised that you go directly to the emergency department this evening for treatment of your elevated blood sugar.   In regards to today's other tests:  Due to concern for possibly having Covid-19, it is advised that you self-isolate at home until test results come back (usually about 2 days).  If positive, it is recommended you stay isolated for at least 10 days after symptom onset and 24 hours after last fever without taking medication (whichever is longer), and symptoms improving.  If you MUST go out, please wear a mask at all times, limit contact with others.   Call to schedule a virtual visit with your primary care provider later this week if not improving.  If labs and/or imaging was ordered during your visit, you will ONLY be notified of abnormal test results or if medication changes are indicated. You may view all lab and imaging results in your free [facility] MyChart Account.  If you do not have an account, please follow the instructions in this discharge paperwork.  The code provided for new patients is only good for 1 month so be sure to activate as soon as possible.

## 2019-07-23 NOTE — ED Triage Notes (Signed)
Patient has been feeling ill for about 2 weeks; grandson had pneumonia prior to this onset; no known fever; is fatigued; coughing with chest hurting; some shortness of breath; congestion with green discharge. Pressure on bladder.

## 2019-07-23 NOTE — ED Provider Notes (Signed)
[name]    CSN: [id number] Arrival date & time: 07/23/19  1927      History   Chief Complaint Chief Complaint  Patient presents with  . Cough  . Nasal Congestion  . Fatigue    HPI Sierra Henderson is a 49 y.o. female.   HPI Sierra Henderson is a 49 y.o. female presenting to UC with c/o gradually worsening fatigue, body aches, cough and congestion for about 2 weeks. She notes she was around her 2yo grandson about 2 weeks ago and he was dx with pneumonia. She got sick the day after visiting him. She does not know if he was tested for Covid.  She has had mild SOB and chest soreness with the cough.  Coughing up green discharge at times.   She also reports about 1 week of bladder pressure and urinary frequency. She has not taken anything for her symptoms because over the counter medications have caused infections in the past.   Pt notes she has also had vaginal itching and discharge for about 1 week. Denies concern for STIs.   Past Medical History:  Diagnosis Date  . Arthritis   . Asthma   . Cervical spinal stenosis   . Diabetes mellitus without complication ([place])   . Low back pain   . Nummular dermatitis   . Obesity   . Ovarian cyst   . Sjoegren syndrome   . Sleep apnea     There are no problems to display for this patient.   Past Surgical History:  Procedure Laterality Date  . ADENOIDECTOMY    . CESAREAN SECTION    . CHOLECYSTECTOMY    . TONSILLECTOMY      OB History   No obstetric history on file.      Home Medications    Prior to Admission medications   Medication Sig Start Date End Date Taking? Authorizing Provider  albuterol (PROVENTIL) (5 MG/ML) 0.5% nebulizer solution Take 2.5 mg by nebulization every 6 (six) hours as needed for wheezing or shortness of breath.    [provider]  albuterol (VENTOLIN HFA) 108 (90 Base) MCG/ACT inhaler Inhale 2 puffs into the lungs every 4 (four) hours as needed for wheezing or shortness of breath.  04/01/19   Kandra Nicolas, MD  amoxicillin (AMOXIL) 875 MG tablet Take 1 tablet (875 mg total) by mouth 2 (two) times daily. 04/01/19   Kandra Nicolas, MD  benzonatate (TESSALON) 100 MG capsule Take 1-2 capsules (100-200 mg total) by mouth every 8 (eight) hours. 07/23/19   Noe Gens, PA-C  calcium-vitamin D (OSCAL WITH D) 250-125 MG-UNIT tablet Take 1 tablet by mouth daily.    [provider]  chlorhexidine (PERIDEX) 0.12 % solution Use as directed 15 mLs in the mouth or throat 2 (two) times daily. Swish, gargle for 60 seconds, then spit. 11/19/18   Noe Gens, PA-C  clindamycin (CLEOCIN) 300 MG capsule Take 1 capsule (300 mg total) by mouth 3 (three) times daily. X 7 days 11/19/18   Noe Gens, PA-C  doxycycline (VIBRAMYCIN) 100 MG capsule Take 1 capsule (100 mg total) by mouth 2 (two) times daily. 06/12/19   Blue, Olivia C, PA-C  fluticasone (FLONASE) 50 MCG/ACT nasal spray Place 2 sprays into both nostrils daily. 04/01/19 03/31/20  Kandra Nicolas, MD  gabapentin (NEURONTIN) 300 MG capsule Take 300 mg by mouth 2 (two) times daily.    [provider]  glipiZIDE (GLUCOTROL) 10 MG tablet Take 10  mg by mouth daily before breakfast.    [provider]  hydroxychloroquine (PLAQUENIL) 200 MG tablet Take by mouth 2 (two) times daily.    [provider]  levonorgestrel (MIRENA) 20 MCG/24HR IUD 1 each by Intrauterine route once.    [provider]  meloxicam (MOBIC) 7.5 MG tablet Take 7.5 mg by mouth daily.    [provider]  nystatin (MYCOSTATIN/NYSTOP) powder Apply topically 3 (three) times daily.    [provider]  pilocarpine (SALAGEN) 5 MG tablet Take by mouth 3 (three) times daily.    [provider]  tizanidine (ZANAFLEX) 2 MG capsule Take 2 mg by mouth 3 (three) times daily.    [provider]  triamcinolone ointment (KENALOG) 0.5 % Apply 1 application topically 2 (two) times daily.    [provider]    Family History Family History  Problem Relation Age of Onset  . Hypertension Mother   . Diabetes Mother   . Hypertension Father   . Diabetes Father     Social History Social History   Tobacco Use  . Smoking status: Never Smoker  . Smokeless tobacco: Never Used  Substance Use Topics  . Alcohol use: Yes    Comment: socially  . Drug use: No     Allergies   Latex, Metformin and related, and Augmentin [amoxicillin-pot clavulanate]   Review of Systems Review of Systems  Constitutional: Positive for fatigue. Negative for chills and fever.  HENT: Positive for congestion. Negative for ear pain, sore throat, trouble swallowing and voice change.   Respiratory: Positive for cough and shortness of breath.   Cardiovascular: Negative for chest pain and palpitations.  Gastrointestinal: Positive for diarrhea (loose stool). Negative for abdominal pain, nausea and vomiting.  Genitourinary: Positive for frequency, vaginal discharge and vaginal pain (itching, irritation). Negative for dysuria.  Musculoskeletal: Positive for arthralgias and myalgias. Negative for back pain.       Body aches  Skin: Negative for rash.  Neurological: Positive for weakness (generalized) and headaches. Negative for dizziness and light-headedness.  All other systems reviewed and are negative.    Physical Exam Triage Vital Signs ED Triage Vitals  Enc Vitals Group     BP 07/23/19 1941 112/80     Pulse Rate 07/23/19 1941 (!) 112     Resp 07/23/19 1941 18     Temp 07/23/19 1941 98.5 F (36.9 C)     Temp Source 07/23/19 1941 Oral     SpO2 07/23/19 1941 99 %     Weight 07/23/19 1942 185 lb (83.9 kg)     Height 07/23/19 1942 5\' 3"  (1.6 m)     Head Circumference --      Peak Flow --      Pain Score 07/23/19 1942 10     Pain Loc --      Pain Edu? --      Excl. in [place]? --    No data found.  Updated Vital Signs BP 112/80 (BP Location: Right Arm)   Pulse (!) 107   Temp 98.5 F (36.9 C) (Oral)    Resp 18   Ht 5\' 3"  (1.6 m)   Wt 185 lb (83.9 kg)   SpO2 98%   BMI 32.77 kg/m   Visual Acuity Right Eye Distance:   Left Eye Distance:   Bilateral Distance:    Right Eye Near:   Left Eye Near:    Bilateral Near:     Physical Exam Vitals and nursing  note reviewed.  Constitutional:      Appearance: Normal appearance. She is well-developed. She is ill-appearing (appears not to feel well, fatigued.). She is not toxic-appearing.  HENT:     Head: Normocephalic and atraumatic.     Right Ear: Tympanic membrane and ear canal normal.     Left Ear: Tympanic membrane and ear canal normal.     Nose: Nose normal.     Right Sinus: No maxillary sinus tenderness or frontal sinus tenderness.     Left Sinus: No maxillary sinus tenderness or frontal sinus tenderness.     Mouth/Throat:     Lips: Pink.     Mouth: Mucous membranes are moist.     Pharynx: Oropharynx is clear. Uvula midline.  Cardiovascular:     Rate and Rhythm: Regular rhythm. Tachycardia present.  Pulmonary:     Effort: Pulmonary effort is normal. No respiratory distress.     Breath sounds: Normal breath sounds. No stridor. No wheezing, rhonchi or rales.  Genitourinary:    Comments: Deferred, pt performed vaginal self-swab Musculoskeletal:        General: Normal range of motion.     Cervical back: Normal range of motion and neck supple. No tenderness.  Lymphadenopathy:     Cervical: No cervical adenopathy.  Skin:    General: Skin is warm and dry.     Capillary Refill: Capillary refill takes less than 2 seconds.     Findings: No rash.  Neurological:     Mental Status: She is alert and oriented to person, place, and time.  Psychiatric:        Behavior: Behavior normal.      UC Treatments / Results  Labs (all labs ordered are listed, but only abnormal results are displayed) Labs Reviewed  POCT URINALYSIS DIP (MANUAL ENTRY) - Abnormal; Notable for the following components:      Result Value   Color, UA light yellow  (*)    Glucose, UA =500 (*)    Blood, UA trace-intact (*)    All other components within normal limits  POCT FASTING CBG [facility] MANUAL ENTRY - Abnormal; Notable for the following components:   POCT Glucose ([facility]) 491 (*)    All other components within normal limits  NOVEL CORONAVIRUS, NAA  URINE CULTURE  CERVICOVAGINAL ANCILLARY ONLY    EKG   Radiology DG Chest 2 View  Result Date: 07/23/2019 CLINICAL DATA:  Productive cough.  Shortness of breath. EXAM: CHEST - 2 VIEW COMPARISON:  Two-view chest x-ray 11/03/2017. FINDINGS: The heart size and mediastinal contours are within normal limits. Both lungs are clear. The visualized skeletal structures are unremarkable. IMPRESSION: Negative two view chest x-ray Electronically Signed   By: San Morelle M.D.   On: 07/23/2019 20:08    Procedures Procedures (including critical care time)  Medications Ordered in UC Medications - No data to display  Initial Impression / Assessment and Plan / UC Course  I have reviewed the triage vital signs and the nursing notes.  Pertinent labs & imaging results that were available during my care of the patient were reviewed by me and considered in my medical decision making (see chart for details).     Discussed CXR: no evidence of bronchitis or pneumonia.  Will do Covid Test UA: no evidence of UTI today.  Pt performed vaginal self swab to be tested for yeast UA significant for glucose- 500, pt states she has not checked her sugar in about 1 week and notes she has been  eating and drinking "whatever she wants" POC Glucose- 491 Due to reported symptoms and elevated CBG, advised pt she needs further evaluation and treatment in emergency department this evening. She declined EMS transport. She feels comfortable driving herself to [facility] Emergency Department, who was notified of pt's pending arrival via [place]. Pt discharged in stable condition.   Final Clinical Impressions(s) / UC  Diagnoses   Final diagnoses:  Sensation of pressure in bladder area  URI with cough and congestion  Body aches  Vaginitis and vulvovaginitis  Diabetes mellitus due to underlying condition, uncontrolled, with hypoglycemia without coma [facility])     Discharge Instructions     Your blood sugar is 491.  This is a dangerous level, especially with your reported symptoms of fatigue and shortness of breath. It is advised that you go directly to the emergency department this evening for treatment of your elevated blood sugar.   In regards to today's other tests:  Due to concern for possibly having Covid-19, it is advised that you self-isolate at home until test results come back (usually about 2 days).  If positive, it is recommended you stay isolated for at least 10 days after symptom onset and 24 hours after last fever without taking medication (whichever is longer), and symptoms improving.  If you MUST go out, please wear a mask at all times, limit contact with others.   Call to schedule a virtual visit with your primary care provider later this week if not improving.  If labs and/or imaging was ordered during your visit, you will ONLY be notified of abnormal test results or if medication changes are indicated. You may view all lab and imaging results in your free [facility] MyChart Account.  If you do not have an account, please follow the instructions in this discharge paperwork.  The code provided for new patients is only good for 1 month so be sure to activate as soon as possible.      ED Prescriptions    Medication Sig Dispense Auth. Provider   benzonatate (TESSALON) 100 MG capsule Take 1-2 capsules (100-200 mg total) by mouth every 8 (eight) hours. 21 capsule Noe Gens, [place]     PDMP not reviewed this encounter.   Noe Gens, [place] 07/23/19 2052

## 2019-07-24 ENCOUNTER — Telehealth: Payer: Self-pay | Admitting: *Deleted

## 2019-07-24 NOTE — Telephone Encounter (Signed)
Spoke to pt she reports that she did not go to the ED last night. She states that she will go today after work.Reminded her of the importance of going to the ED for further evaluation. Pt verbalized understanding.

## 2019-07-25 LAB — URINE CULTURE
MICRO NUMBER:: 10307426
SPECIMEN QUALITY:: ADEQUATE

## 2019-07-25 LAB — SARS-COV-2, NAA 2 DAY TAT

## 2019-07-25 LAB — NOVEL CORONAVIRUS, NAA: SARS-CoV-2, NAA: NOT DETECTED

## 2019-07-25 LAB — CERVICOVAGINAL ANCILLARY ONLY
Bacterial vaginitis: NEGATIVE
Candida vaginitis: POSITIVE — AB

## 2019-07-26 ENCOUNTER — Telehealth (HOSPITAL_COMMUNITY): Payer: Self-pay

## 2019-07-26 DIAGNOSIS — B3731 Acute candidiasis of vulva and vagina: Secondary | ICD-10-CM

## 2019-07-26 DIAGNOSIS — B373 Candidiasis of vulva and vagina: Secondary | ICD-10-CM

## 2019-07-26 MED ORDER — FLUCONAZOLE 150 MG PO TABS: 150.0000 mg | ORAL_TABLET | Freq: Every day | ORAL | 0 refills | Status: AC

## 2019-07-26 NOTE — Telephone Encounter (Signed)
Patient contacted by phone and made aware of    results. Pt verbalized understanding and had all questions answered.  Your test for COVID-19 was negative.  Please continue good preventive care measures, including:  frequent hand-washing, avoid touching your face, cover coughs/sneezes, stay out of crowds and keep a 6 foot distance from others.  If you develop fever/cough/breathlessness, please stay home for 10 days AND until you have had more than 24 hours without fever (without taking a fever reducer) and with cough/breathlessness improving. Go to the nearest hospital emergency room if fever/cough/breathlessness are severe or illness seems like a threat to life.   Test for candida (yeast) was positive.  Prescription for fluconazole 150mg  po now, repeat dose in 3d if needed, #2 no refills, sent to the pharmacy of record.  Recheck or followup with PCP for further evaluation if symptoms are not improving.

## 2019-07-30 DIAGNOSIS — Z23 Encounter for immunization: Secondary | ICD-10-CM | POA: Diagnosis not present

## 2019-10-25 DIAGNOSIS — Z419 Encounter for procedure for purposes other than remedying health state, unspecified: Secondary | ICD-10-CM | POA: Diagnosis not present

## 2019-11-25 DIAGNOSIS — Z419 Encounter for procedure for purposes other than remedying health state, unspecified: Secondary | ICD-10-CM | POA: Diagnosis not present

## 2019-12-26 DIAGNOSIS — Z419 Encounter for procedure for purposes other than remedying health state, unspecified: Secondary | ICD-10-CM | POA: Diagnosis not present

## 2020-01-25 DIAGNOSIS — Z419 Encounter for procedure for purposes other than remedying health state, unspecified: Secondary | ICD-10-CM | POA: Diagnosis not present

## 2020-02-21 DIAGNOSIS — R109 Unspecified abdominal pain: Secondary | ICD-10-CM | POA: Diagnosis not present

## 2020-02-21 DIAGNOSIS — R110 Nausea: Secondary | ICD-10-CM | POA: Diagnosis not present

## 2020-02-25 DIAGNOSIS — Z419 Encounter for procedure for purposes other than remedying health state, unspecified: Secondary | ICD-10-CM | POA: Diagnosis not present

## 2020-02-27 DIAGNOSIS — Z20822 Contact with and (suspected) exposure to covid-19: Secondary | ICD-10-CM | POA: Diagnosis not present

## 2020-03-18 DIAGNOSIS — K219 Gastro-esophageal reflux disease without esophagitis: Secondary | ICD-10-CM | POA: Diagnosis not present

## 2020-03-18 DIAGNOSIS — K5900 Constipation, unspecified: Secondary | ICD-10-CM | POA: Diagnosis not present

## 2020-03-18 DIAGNOSIS — K582 Mixed irritable bowel syndrome: Secondary | ICD-10-CM | POA: Diagnosis not present

## 2020-03-18 DIAGNOSIS — R1319 Other dysphagia: Secondary | ICD-10-CM | POA: Diagnosis not present

## 2020-03-18 DIAGNOSIS — R112 Nausea with vomiting, unspecified: Secondary | ICD-10-CM | POA: Diagnosis not present

## 2020-03-26 DIAGNOSIS — R1319 Other dysphagia: Secondary | ICD-10-CM | POA: Diagnosis not present

## 2020-03-26 DIAGNOSIS — R112 Nausea with vomiting, unspecified: Secondary | ICD-10-CM | POA: Diagnosis not present

## 2020-03-26 DIAGNOSIS — Z419 Encounter for procedure for purposes other than remedying health state, unspecified: Secondary | ICD-10-CM | POA: Diagnosis not present

## 2020-03-26 DIAGNOSIS — K222 Esophageal obstruction: Secondary | ICD-10-CM | POA: Diagnosis not present

## 2020-03-26 DIAGNOSIS — E119 Type 2 diabetes mellitus without complications: Secondary | ICD-10-CM | POA: Diagnosis not present

## 2020-04-24 ENCOUNTER — Encounter: Payer: Self-pay | Admitting: Emergency Medicine

## 2020-04-24 ENCOUNTER — Emergency Department (INDEPENDENT_AMBULATORY_CARE_PROVIDER_SITE_OTHER)
Admission: EM | Admit: 2020-04-24 | Discharge: 2020-04-24 | Disposition: A | Discharge status: Home / Self Care | Payer: Medicaid Other | Source: Home / Self Care

## 2020-04-24 ENCOUNTER — Other Ambulatory Visit: Payer: Self-pay

## 2020-04-24 DIAGNOSIS — J029 Acute pharyngitis, unspecified: Secondary | ICD-10-CM | POA: Diagnosis not present

## 2020-04-24 DIAGNOSIS — Z20822 Contact with and (suspected) exposure to covid-19: Secondary | ICD-10-CM

## 2020-04-24 DIAGNOSIS — J069 Acute upper respiratory infection, unspecified: Secondary | ICD-10-CM

## 2020-04-24 DIAGNOSIS — J020 Streptococcal pharyngitis: Secondary | ICD-10-CM | POA: Diagnosis not present

## 2020-04-24 LAB — POCT RAPID STREP A (OFFICE): Rapid Strep A Screen: POSITIVE — AB

## 2020-04-24 MED ORDER — AZITHROMYCIN 250 MG PO TABS: 250.0000 mg | ORAL_TABLET | Freq: Every day | ORAL | 0 refills | Status: DC

## 2020-04-24 NOTE — Discharge Instructions (Signed)

## 2020-04-24 NOTE — ED Provider Notes (Signed)
[name]    CSN: [id number] Arrival date & time: 04/24/20  1830      History   Chief Complaint Chief Complaint  Patient presents with  . Covid Exposure    HPI Sierra Henderson is a 49 y.o. female.   HPI  Sierra Henderson is a 49 y.o. female presenting to UC with c/o fever, HA, diarrhea, cough, congestion and sore throat for 1 week after being exposed to someone with COVID 1.5 weeks ago. Pt reports fever Tmax 101*F all week. She has also developed sinus pressure.  No medications taken PTA.   Past Medical History:  Diagnosis Date  . Arthritis   . Asthma   . Cervical spinal stenosis   . Diabetes mellitus without complication ([facility])   . Low back pain   . Nummular dermatitis   . Obesity   . Ovarian cyst   . Sjoegren syndrome   . Sleep apnea     There are no problems to display for this patient.   Past Surgical History:  Procedure Laterality Date  . ADENOIDECTOMY    . CESAREAN SECTION    . CHOLECYSTECTOMY    . TONSILLECTOMY      OB History   No obstetric history on file.      Home Medications    Prior to Admission medications   Medication Sig Start Date End Date Taking? Authorizing Provider  albuterol (PROVENTIL) (5 MG/ML) 0.5% nebulizer solution Take 2.5 mg by nebulization every 6 (six) hours as needed for wheezing or shortness of breath.    [provider]  albuterol (VENTOLIN HFA) 108 (90 Base) MCG/ACT inhaler Inhale 2 puffs into the lungs every 4 (four) hours as needed for wheezing or shortness of breath. 04/01/19   Lattie Haw, MD  azithromycin (ZITHROMAX) 250 MG tablet Take 1 tablet (250 mg total) by mouth daily. Take first 2 tablets together, then 1 every day until finished. 04/24/20   Lurene Shadow, PA-C  calcium-vitamin D (OSCAL WITH D) 250-125 MG-UNIT tablet Take 1 tablet by mouth daily.    [provider]  chlorhexidine (PERIDEX) 0.12 % solution Use as directed 15 mLs in the mouth or throat 2 (two) times daily. Swish,  gargle for 60 seconds, then spit. 11/19/18   Lurene Shadow, PA-C  doxycycline (VIBRAMYCIN) 100 MG capsule Take 1 capsule (100 mg total) by mouth 2 (two) times daily. 06/12/19   Blue, Olivia C, PA-C  fluticasone (FLONASE) 50 MCG/ACT nasal spray Place 2 sprays into both nostrils daily. 04/01/19 03/31/20  Lattie Haw, MD  gabapentin (NEURONTIN) 300 MG capsule Take 300 mg by mouth 2 (two) times daily.    [provider]  glipiZIDE (GLUCOTROL) 10 MG tablet Take 10 mg by mouth daily before breakfast.    [provider]  hydroxychloroquine (PLAQUENIL) 200 MG tablet Take by mouth 2 (two) times daily.    [provider]  levonorgestrel (MIRENA) 20 MCG/24HR IUD 1 each by Intrauterine route once.    [provider]  meloxicam (MOBIC) 7.5 MG tablet Take 7.5 mg by mouth daily.    [provider]  nystatin (MYCOSTATIN/NYSTOP) powder Apply topically 3 (three) times daily.    [provider]  pilocarpine (SALAGEN) 5 MG tablet Take by mouth 3 (three) times daily.    [provider]  tizanidine (ZANAFLEX) 2 MG capsule Take 2 mg by mouth 3 (three) times daily.    [provider]  triamcinolone ointment (KENALOG) 0.5 % Apply 1  application topically 2 (two) times daily.    [provider]    Family History Family History  Problem Relation Age of Onset  . Hypertension Mother   . Diabetes Mother   . Hypertension Father   . Diabetes Father     Social History Social History   Tobacco Use  . Smoking status: Never Smoker  . Smokeless tobacco: Never Used  Vaping Use  . Vaping Use: Never used  Substance Use Topics  . Alcohol use: Yes    Comment: socially  . Drug use: No     Allergies   Latex, Metformin and related, and Augmentin [amoxicillin-pot clavulanate]   Review of Systems Review of Systems  Constitutional: Positive for fever. Negative for chills.  HENT: Positive for congestion, sinus pressure, sinus pain and sore  throat. Negative for ear pain, trouble swallowing and voice change.   Respiratory: Positive for cough. Negative for shortness of breath.   Cardiovascular: Negative for chest pain and palpitations.  Gastrointestinal: Positive for diarrhea. Negative for abdominal pain, nausea and vomiting.  Musculoskeletal: Positive for arthralgias, back pain and myalgias.  Skin: Negative for rash.  Neurological: Positive for headaches. Negative for dizziness and light-headedness.  All other systems reviewed and are negative.    Physical Exam Triage Vital Signs ED Triage Vitals  Enc Vitals Group     BP 04/24/20 2002 116/80     Pulse Rate 04/24/20 2002 93     Resp 04/24/20 2002 18     Temp 04/24/20 2002 98.5 F (36.9 C)     Temp Source 04/24/20 2002 Oral     SpO2 04/24/20 2002 97 %     Weight 04/24/20 2004 178 lb (80.7 kg)     Height 04/24/20 2004 5\' 3"  (1.6 m)     Head Circumference --      Peak Flow --      Pain Score 04/24/20 2004 2     Pain Loc --      Pain Edu? --      Excl. in [place]? --    No data found.  Updated Vital Signs BP 116/80 (BP Location: Right Arm)   Pulse 93   Temp 98.5 F (36.9 C) (Oral)   Resp 18   Ht 5\' 3"  (1.6 m)   Wt 178 lb (80.7 kg)   SpO2 97%   BMI 31.53 kg/m   Visual Acuity Right Eye Distance:   Left Eye Distance:   Bilateral Distance:    Right Eye Near:   Left Eye Near:    Bilateral Near:     Physical Exam Vitals and nursing note reviewed.  Constitutional:      General: She is not in acute distress.    Appearance: Normal appearance. She is well-developed and well-nourished. She is not ill-appearing, toxic-appearing or diaphoretic.  HENT:     Head: Normocephalic and atraumatic.     Right Ear: Tympanic membrane and ear canal normal.     Left Ear: Tympanic membrane and ear canal normal.     Nose: Congestion present.     Right Sinus: Frontal sinus tenderness present. No maxillary sinus tenderness.     Left Sinus: Frontal sinus tenderness present. No  maxillary sinus tenderness.     Mouth/Throat:     Lips: Pink.     Mouth: Mucous membranes are moist.     Pharynx: Oropharynx is clear. Uvula midline. Posterior oropharyngeal erythema and uvula swelling present. No pharyngeal swelling or oropharyngeal exudate.  Tonsils: No tonsillar exudate or tonsillar abscesses. 2+ on the right. 2+ on the left.  Eyes:     Extraocular Movements: EOM normal.  Cardiovascular:     Rate and Rhythm: Normal rate and regular rhythm.  Pulmonary:     Effort: Pulmonary effort is normal. No respiratory distress.     Breath sounds: Normal breath sounds. No stridor. No wheezing, rhonchi or rales.  Musculoskeletal:        General: Normal range of motion.     Cervical back: Normal range of motion and neck supple. No tenderness.  Lymphadenopathy:     Cervical: Cervical adenopathy present.  Skin:    General: Skin is warm and dry.  Neurological:     Mental Status: She is alert and oriented to person, place, and time.  Psychiatric:        Mood and Affect: Mood and affect normal.        Behavior: Behavior normal.      UC Treatments / Results  Labs (all labs ordered are listed, but only abnormal results are displayed) Labs Reviewed  POCT RAPID STREP A (OFFICE) - Abnormal; Notable for the following components:      Result Value   Rapid Strep A Screen Positive (*)    All other components within normal limits  COVID-19, FLU A+B AND RSV    EKG   Radiology No results found.  Procedures Procedures (including critical care time)  Medications Ordered in UC Medications - No data to display  Initial Impression / Assessment and Plan / UC Course  I have reviewed the triage vital signs and the nursing notes.  Pertinent labs & imaging results that were available during my care of the patient were reviewed by me and considered in my medical decision making (see chart for details).     Rapid strep: POSITIVE COVID/Flu/RSV pending Rx: azithromycin (pt  reports severe diarrhea with augmentin) F/u with PCP  AVS given  Final Clinical Impressions(s) / UC Diagnoses   Final diagnoses:  Exposure to COVID-19 virus  Acute upper respiratory infection  Acute pharyngitis, unspecified etiology  Strep pharyngitis     Discharge Instructions      Please take antibiotics as prescribed and be sure to complete entire course even if you start to feel better to ensure infection does not come back.  You may take 500mg  acetaminophen every 4-6 hours or in combination with ibuprofen 400-600mg  every 6-8 hours as needed for pain, inflammation, and fever.  Be sure to well hydrated with clear liquids and get at least 8 hours of sleep at night, preferably more while sick.   Please follow up with family medicine in 1 week if needed.     ED Prescriptions    Medication Sig Dispense Auth. Provider   azithromycin (ZITHROMAX) 250 MG tablet  (Status: Discontinued) Take 1 tablet (250 mg total) by mouth daily. Take first 2 tablets together, then 1 every day until finished. 6 tablet , Ksenia Kunz O, PA-C   azithromycin (ZITHROMAX) 250 MG tablet Take 1 tablet (250 mg total) by mouth daily. Take first 2 tablets together, then 1 every day until finished. 6 tablet 12-[birth year]1986, PA-C     PDMP not reviewed this encounter.   Lurene Shadow, Lurene Shadow 04/25/20 [phone]

## 2020-04-24 NOTE — ED Triage Notes (Signed)
Covid Exposure, Fever, headache, dirrhea, cough, sore throat x 1 week Vaccinated

## 2020-04-26 DIAGNOSIS — Z419 Encounter for procedure for purposes other than remedying health state, unspecified: Secondary | ICD-10-CM | POA: Diagnosis not present

## 2020-04-28 LAB — COVID-19, FLU A+B AND RSV
Influenza A, NAA: NOT DETECTED
Influenza B, NAA: NOT DETECTED
RSV, NAA: NOT DETECTED
SARS-CoV-2, NAA: DETECTED — AB

## 2020-05-13 DIAGNOSIS — L723 Sebaceous cyst: Secondary | ICD-10-CM | POA: Diagnosis not present

## 2020-05-14 ENCOUNTER — Telehealth: Payer: Self-pay | Admitting: Family Medicine

## 2020-05-14 NOTE — Telephone Encounter (Signed)
I attempted to reach Sierra Henderson today to get her scheduled  with the High Risk Nurse Case Manager. I left my name and number for her to call me back. I will reach out again in the next 7-14 days if I do not hear back from her.

## 2020-05-23 DIAGNOSIS — R112 Nausea with vomiting, unspecified: Secondary | ICD-10-CM | POA: Diagnosis not present

## 2020-05-23 DIAGNOSIS — K219 Gastro-esophageal reflux disease without esophagitis: Secondary | ICD-10-CM | POA: Diagnosis not present

## 2020-05-23 DIAGNOSIS — K582 Mixed irritable bowel syndrome: Secondary | ICD-10-CM | POA: Diagnosis not present

## 2020-05-27 DIAGNOSIS — Z419 Encounter for procedure for purposes other than remedying health state, unspecified: Secondary | ICD-10-CM | POA: Diagnosis not present

## 2020-06-03 ENCOUNTER — Telehealth: Payer: Self-pay | Admitting: Family Medicine

## 2020-06-03 NOTE — Telephone Encounter (Signed)
I spoke with Sierra Henderson today to get her scheduled for a phone visit with the Managed Medicaid Team but she declined the services.

## 2020-06-09 DIAGNOSIS — L680 Hirsutism: Secondary | ICD-10-CM | POA: Diagnosis not present

## 2020-06-09 DIAGNOSIS — D171 Benign lipomatous neoplasm of skin and subcutaneous tissue of trunk: Secondary | ICD-10-CM | POA: Diagnosis not present

## 2020-06-09 DIAGNOSIS — L304 Erythema intertrigo: Secondary | ICD-10-CM | POA: Diagnosis not present

## 2020-06-09 DIAGNOSIS — L700 Acne vulgaris: Secondary | ICD-10-CM | POA: Diagnosis not present

## 2020-06-16 DIAGNOSIS — D171 Benign lipomatous neoplasm of skin and subcutaneous tissue of trunk: Secondary | ICD-10-CM | POA: Insufficient documentation

## 2020-06-24 DIAGNOSIS — Z419 Encounter for procedure for purposes other than remedying health state, unspecified: Secondary | ICD-10-CM | POA: Diagnosis not present

## 2020-06-25 DIAGNOSIS — R112 Nausea with vomiting, unspecified: Secondary | ICD-10-CM | POA: Diagnosis not present

## 2020-06-25 DIAGNOSIS — K582 Mixed irritable bowel syndrome: Secondary | ICD-10-CM | POA: Diagnosis not present

## 2020-07-14 DIAGNOSIS — K458 Other specified abdominal hernia without obstruction or gangrene: Secondary | ICD-10-CM | POA: Insufficient documentation

## 2020-07-14 DIAGNOSIS — D171 Benign lipomatous neoplasm of skin and subcutaneous tissue of trunk: Secondary | ICD-10-CM | POA: Diagnosis not present

## 2020-07-25 DIAGNOSIS — Z419 Encounter for procedure for purposes other than remedying health state, unspecified: Secondary | ICD-10-CM | POA: Diagnosis not present

## 2020-07-28 DIAGNOSIS — K458 Other specified abdominal hernia without obstruction or gangrene: Secondary | ICD-10-CM | POA: Diagnosis not present

## 2020-07-28 DIAGNOSIS — D171 Benign lipomatous neoplasm of skin and subcutaneous tissue of trunk: Secondary | ICD-10-CM | POA: Diagnosis not present

## 2020-08-08 DIAGNOSIS — K469 Unspecified abdominal hernia without obstruction or gangrene: Secondary | ICD-10-CM | POA: Diagnosis not present

## 2020-08-22 DIAGNOSIS — D171 Benign lipomatous neoplasm of skin and subcutaneous tissue of trunk: Secondary | ICD-10-CM | POA: Diagnosis not present

## 2020-08-22 DIAGNOSIS — D481 Neoplasm of uncertain behavior of connective and other soft tissue: Secondary | ICD-10-CM | POA: Diagnosis not present

## 2020-08-22 DIAGNOSIS — D214 Benign neoplasm of connective and other soft tissue of abdomen: Secondary | ICD-10-CM | POA: Diagnosis not present

## 2020-08-24 DIAGNOSIS — Z419 Encounter for procedure for purposes other than remedying health state, unspecified: Secondary | ICD-10-CM | POA: Diagnosis not present

## 2020-09-09 DIAGNOSIS — E1169 Type 2 diabetes mellitus with other specified complication: Secondary | ICD-10-CM | POA: Diagnosis not present

## 2020-09-09 DIAGNOSIS — E785 Hyperlipidemia, unspecified: Secondary | ICD-10-CM | POA: Diagnosis not present

## 2020-09-24 DIAGNOSIS — E785 Hyperlipidemia, unspecified: Secondary | ICD-10-CM | POA: Diagnosis not present

## 2020-09-24 DIAGNOSIS — Z419 Encounter for procedure for purposes other than remedying health state, unspecified: Secondary | ICD-10-CM | POA: Diagnosis not present

## 2020-09-24 DIAGNOSIS — E1169 Type 2 diabetes mellitus with other specified complication: Secondary | ICD-10-CM | POA: Diagnosis not present

## 2020-09-29 DIAGNOSIS — E1169 Type 2 diabetes mellitus with other specified complication: Secondary | ICD-10-CM | POA: Diagnosis not present

## 2020-09-29 DIAGNOSIS — E785 Hyperlipidemia, unspecified: Secondary | ICD-10-CM | POA: Diagnosis not present

## 2020-10-08 DIAGNOSIS — E1169 Type 2 diabetes mellitus with other specified complication: Secondary | ICD-10-CM | POA: Diagnosis not present

## 2020-10-08 DIAGNOSIS — E785 Hyperlipidemia, unspecified: Secondary | ICD-10-CM | POA: Diagnosis not present

## 2020-10-14 DIAGNOSIS — E1165 Type 2 diabetes mellitus with hyperglycemia: Secondary | ICD-10-CM | POA: Diagnosis not present

## 2020-10-24 DIAGNOSIS — E1165 Type 2 diabetes mellitus with hyperglycemia: Secondary | ICD-10-CM | POA: Diagnosis not present

## 2020-10-24 DIAGNOSIS — Z419 Encounter for procedure for purposes other than remedying health state, unspecified: Secondary | ICD-10-CM | POA: Diagnosis not present

## 2020-10-28 DIAGNOSIS — W57XXXA Bitten or stung by nonvenomous insect and other nonvenomous arthropods, initial encounter: Secondary | ICD-10-CM | POA: Diagnosis not present

## 2020-10-28 DIAGNOSIS — S90562A Insect bite (nonvenomous), left ankle, initial encounter: Secondary | ICD-10-CM | POA: Diagnosis not present

## 2020-11-05 DIAGNOSIS — D216 Benign neoplasm of connective and other soft tissue of trunk, unspecified: Secondary | ICD-10-CM | POA: Diagnosis not present

## 2020-11-12 DIAGNOSIS — D214 Benign neoplasm of connective and other soft tissue of abdomen: Secondary | ICD-10-CM | POA: Diagnosis not present

## 2020-11-12 DIAGNOSIS — D367 Benign neoplasm of other specified sites: Secondary | ICD-10-CM | POA: Diagnosis not present

## 2020-11-12 DIAGNOSIS — D235 Other benign neoplasm of skin of trunk: Secondary | ICD-10-CM | POA: Diagnosis not present

## 2020-11-12 DIAGNOSIS — D216 Benign neoplasm of connective and other soft tissue of trunk, unspecified: Secondary | ICD-10-CM | POA: Diagnosis not present

## 2020-11-24 DIAGNOSIS — Z419 Encounter for procedure for purposes other than remedying health state, unspecified: Secondary | ICD-10-CM | POA: Diagnosis not present

## 2020-12-05 DIAGNOSIS — K219 Gastro-esophageal reflux disease without esophagitis: Secondary | ICD-10-CM | POA: Diagnosis not present

## 2020-12-05 DIAGNOSIS — D219 Benign neoplasm of connective and other soft tissue, unspecified: Secondary | ICD-10-CM | POA: Insufficient documentation

## 2020-12-05 DIAGNOSIS — E119 Type 2 diabetes mellitus without complications: Secondary | ICD-10-CM | POA: Diagnosis not present

## 2020-12-22 DIAGNOSIS — L910 Hypertrophic scar: Secondary | ICD-10-CM | POA: Diagnosis not present

## 2020-12-22 DIAGNOSIS — D219 Benign neoplasm of connective and other soft tissue, unspecified: Secondary | ICD-10-CM | POA: Diagnosis not present

## 2020-12-22 DIAGNOSIS — R600 Localized edema: Secondary | ICD-10-CM | POA: Diagnosis not present

## 2020-12-23 DIAGNOSIS — D219 Benign neoplasm of connective and other soft tissue, unspecified: Secondary | ICD-10-CM | POA: Diagnosis not present

## 2020-12-25 DIAGNOSIS — Z419 Encounter for procedure for purposes other than remedying health state, unspecified: Secondary | ICD-10-CM | POA: Diagnosis not present

## 2021-01-07 DIAGNOSIS — E1165 Type 2 diabetes mellitus with hyperglycemia: Secondary | ICD-10-CM | POA: Diagnosis not present

## 2021-01-09 DIAGNOSIS — D219 Benign neoplasm of connective and other soft tissue, unspecified: Secondary | ICD-10-CM | POA: Diagnosis not present

## 2021-01-24 DIAGNOSIS — Z419 Encounter for procedure for purposes other than remedying health state, unspecified: Secondary | ICD-10-CM | POA: Diagnosis not present

## 2021-02-24 DIAGNOSIS — Z419 Encounter for procedure for purposes other than remedying health state, unspecified: Secondary | ICD-10-CM | POA: Diagnosis not present

## 2021-03-18 DIAGNOSIS — T884XXA Failed or difficult intubation, initial encounter: Secondary | ICD-10-CM | POA: Insufficient documentation

## 2021-03-26 DIAGNOSIS — Z419 Encounter for procedure for purposes other than remedying health state, unspecified: Secondary | ICD-10-CM | POA: Diagnosis not present

## 2021-04-26 DIAGNOSIS — Z419 Encounter for procedure for purposes other than remedying health state, unspecified: Secondary | ICD-10-CM | POA: Diagnosis not present

## 2021-05-15 DIAGNOSIS — M3500 Sicca syndrome, unspecified: Secondary | ICD-10-CM | POA: Diagnosis not present

## 2021-05-15 DIAGNOSIS — K219 Gastro-esophageal reflux disease without esophagitis: Secondary | ICD-10-CM | POA: Insufficient documentation

## 2021-05-15 DIAGNOSIS — Z8616 Personal history of COVID-19: Secondary | ICD-10-CM | POA: Diagnosis not present

## 2021-05-15 DIAGNOSIS — Z79899 Other long term (current) drug therapy: Secondary | ICD-10-CM | POA: Diagnosis not present

## 2021-05-15 DIAGNOSIS — J45909 Unspecified asthma, uncomplicated: Secondary | ICD-10-CM | POA: Diagnosis not present

## 2021-05-15 DIAGNOSIS — G4733 Obstructive sleep apnea (adult) (pediatric): Secondary | ICD-10-CM | POA: Diagnosis not present

## 2021-05-15 DIAGNOSIS — Z6837 Body mass index (BMI) 37.0-37.9, adult: Secondary | ICD-10-CM | POA: Diagnosis not present

## 2021-05-15 DIAGNOSIS — Z7984 Long term (current) use of oral hypoglycemic drugs: Secondary | ICD-10-CM | POA: Diagnosis not present

## 2021-05-15 DIAGNOSIS — D216 Benign neoplasm of connective and other soft tissue of trunk, unspecified: Secondary | ICD-10-CM | POA: Diagnosis not present

## 2021-05-15 DIAGNOSIS — E669 Obesity, unspecified: Secondary | ICD-10-CM | POA: Diagnosis not present

## 2021-05-15 DIAGNOSIS — E119 Type 2 diabetes mellitus without complications: Secondary | ICD-10-CM | POA: Diagnosis not present

## 2021-05-15 DIAGNOSIS — K5900 Constipation, unspecified: Secondary | ICD-10-CM | POA: Diagnosis not present

## 2021-05-25 DIAGNOSIS — Z6837 Body mass index (BMI) 37.0-37.9, adult: Secondary | ICD-10-CM | POA: Diagnosis not present

## 2021-05-25 DIAGNOSIS — D219 Benign neoplasm of connective and other soft tissue, unspecified: Secondary | ICD-10-CM | POA: Diagnosis not present

## 2021-05-25 DIAGNOSIS — Z7984 Long term (current) use of oral hypoglycemic drugs: Secondary | ICD-10-CM | POA: Diagnosis not present

## 2021-05-25 DIAGNOSIS — E119 Type 2 diabetes mellitus without complications: Secondary | ICD-10-CM | POA: Diagnosis not present

## 2021-05-25 DIAGNOSIS — R222 Localized swelling, mass and lump, trunk: Secondary | ICD-10-CM | POA: Diagnosis not present

## 2021-05-25 DIAGNOSIS — G4733 Obstructive sleep apnea (adult) (pediatric): Secondary | ICD-10-CM | POA: Diagnosis not present

## 2021-05-25 DIAGNOSIS — M3500 Sicca syndrome, unspecified: Secondary | ICD-10-CM | POA: Diagnosis not present

## 2021-05-25 DIAGNOSIS — K219 Gastro-esophageal reflux disease without esophagitis: Secondary | ICD-10-CM | POA: Diagnosis not present

## 2021-05-25 DIAGNOSIS — Z8616 Personal history of COVID-19: Secondary | ICD-10-CM | POA: Diagnosis not present

## 2021-05-25 DIAGNOSIS — D216 Benign neoplasm of connective and other soft tissue of trunk, unspecified: Secondary | ICD-10-CM | POA: Diagnosis not present

## 2021-05-25 DIAGNOSIS — Z79899 Other long term (current) drug therapy: Secondary | ICD-10-CM | POA: Diagnosis not present

## 2021-05-25 DIAGNOSIS — J45909 Unspecified asthma, uncomplicated: Secondary | ICD-10-CM | POA: Diagnosis not present

## 2021-05-25 DIAGNOSIS — D214 Benign neoplasm of connective and other soft tissue of abdomen: Secondary | ICD-10-CM | POA: Diagnosis not present

## 2021-05-25 DIAGNOSIS — K5900 Constipation, unspecified: Secondary | ICD-10-CM | POA: Diagnosis not present

## 2021-05-25 DIAGNOSIS — E669 Obesity, unspecified: Secondary | ICD-10-CM | POA: Diagnosis not present

## 2021-05-25 DIAGNOSIS — D213 Benign neoplasm of connective and other soft tissue of thorax: Secondary | ICD-10-CM | POA: Diagnosis not present

## 2021-05-26 DIAGNOSIS — Z7984 Long term (current) use of oral hypoglycemic drugs: Secondary | ICD-10-CM | POA: Diagnosis not present

## 2021-05-26 DIAGNOSIS — K219 Gastro-esophageal reflux disease without esophagitis: Secondary | ICD-10-CM | POA: Diagnosis not present

## 2021-05-26 DIAGNOSIS — J45909 Unspecified asthma, uncomplicated: Secondary | ICD-10-CM | POA: Diagnosis not present

## 2021-05-26 DIAGNOSIS — D216 Benign neoplasm of connective and other soft tissue of trunk, unspecified: Secondary | ICD-10-CM | POA: Diagnosis not present

## 2021-05-26 DIAGNOSIS — Z8616 Personal history of COVID-19: Secondary | ICD-10-CM | POA: Diagnosis not present

## 2021-05-26 DIAGNOSIS — G4733 Obstructive sleep apnea (adult) (pediatric): Secondary | ICD-10-CM | POA: Diagnosis not present

## 2021-05-26 DIAGNOSIS — E119 Type 2 diabetes mellitus without complications: Secondary | ICD-10-CM | POA: Diagnosis not present

## 2021-05-26 DIAGNOSIS — M3500 Sicca syndrome, unspecified: Secondary | ICD-10-CM | POA: Diagnosis not present

## 2021-05-26 DIAGNOSIS — E669 Obesity, unspecified: Secondary | ICD-10-CM | POA: Diagnosis not present

## 2021-05-26 DIAGNOSIS — Z6837 Body mass index (BMI) 37.0-37.9, adult: Secondary | ICD-10-CM | POA: Diagnosis not present

## 2021-05-26 DIAGNOSIS — K5900 Constipation, unspecified: Secondary | ICD-10-CM | POA: Diagnosis not present

## 2021-05-26 DIAGNOSIS — Z79899 Other long term (current) drug therapy: Secondary | ICD-10-CM | POA: Diagnosis not present

## 2021-05-27 DIAGNOSIS — Z419 Encounter for procedure for purposes other than remedying health state, unspecified: Secondary | ICD-10-CM | POA: Diagnosis not present

## 2021-06-24 DIAGNOSIS — Z419 Encounter for procedure for purposes other than remedying health state, unspecified: Secondary | ICD-10-CM | POA: Diagnosis not present

## 2021-07-20 DIAGNOSIS — Z09 Encounter for follow-up examination after completed treatment for conditions other than malignant neoplasm: Secondary | ICD-10-CM | POA: Diagnosis not present

## 2021-07-25 DIAGNOSIS — Z419 Encounter for procedure for purposes other than remedying health state, unspecified: Secondary | ICD-10-CM | POA: Diagnosis not present

## 2021-08-18 DIAGNOSIS — Z794 Long term (current) use of insulin: Secondary | ICD-10-CM | POA: Diagnosis not present

## 2021-08-18 DIAGNOSIS — E1165 Type 2 diabetes mellitus with hyperglycemia: Secondary | ICD-10-CM | POA: Diagnosis not present

## 2021-08-18 DIAGNOSIS — Z6836 Body mass index (BMI) 36.0-36.9, adult: Secondary | ICD-10-CM | POA: Diagnosis not present

## 2021-08-24 DIAGNOSIS — Z419 Encounter for procedure for purposes other than remedying health state, unspecified: Secondary | ICD-10-CM | POA: Diagnosis not present

## 2021-09-24 DIAGNOSIS — Z419 Encounter for procedure for purposes other than remedying health state, unspecified: Secondary | ICD-10-CM | POA: Diagnosis not present

## 2021-10-24 DIAGNOSIS — Z419 Encounter for procedure for purposes other than remedying health state, unspecified: Secondary | ICD-10-CM | POA: Diagnosis not present

## 2021-10-29 IMAGING — DX DG CHEST 2V
2 series · 2 of 2 positions shown · non-contrast
Comparison: Two-view chest x-ray 11/03/2017.

CLINICAL DATA: Productive cough.  Shortness of breath.

EXAM:
CHEST - 2 VIEW

[chest pa]
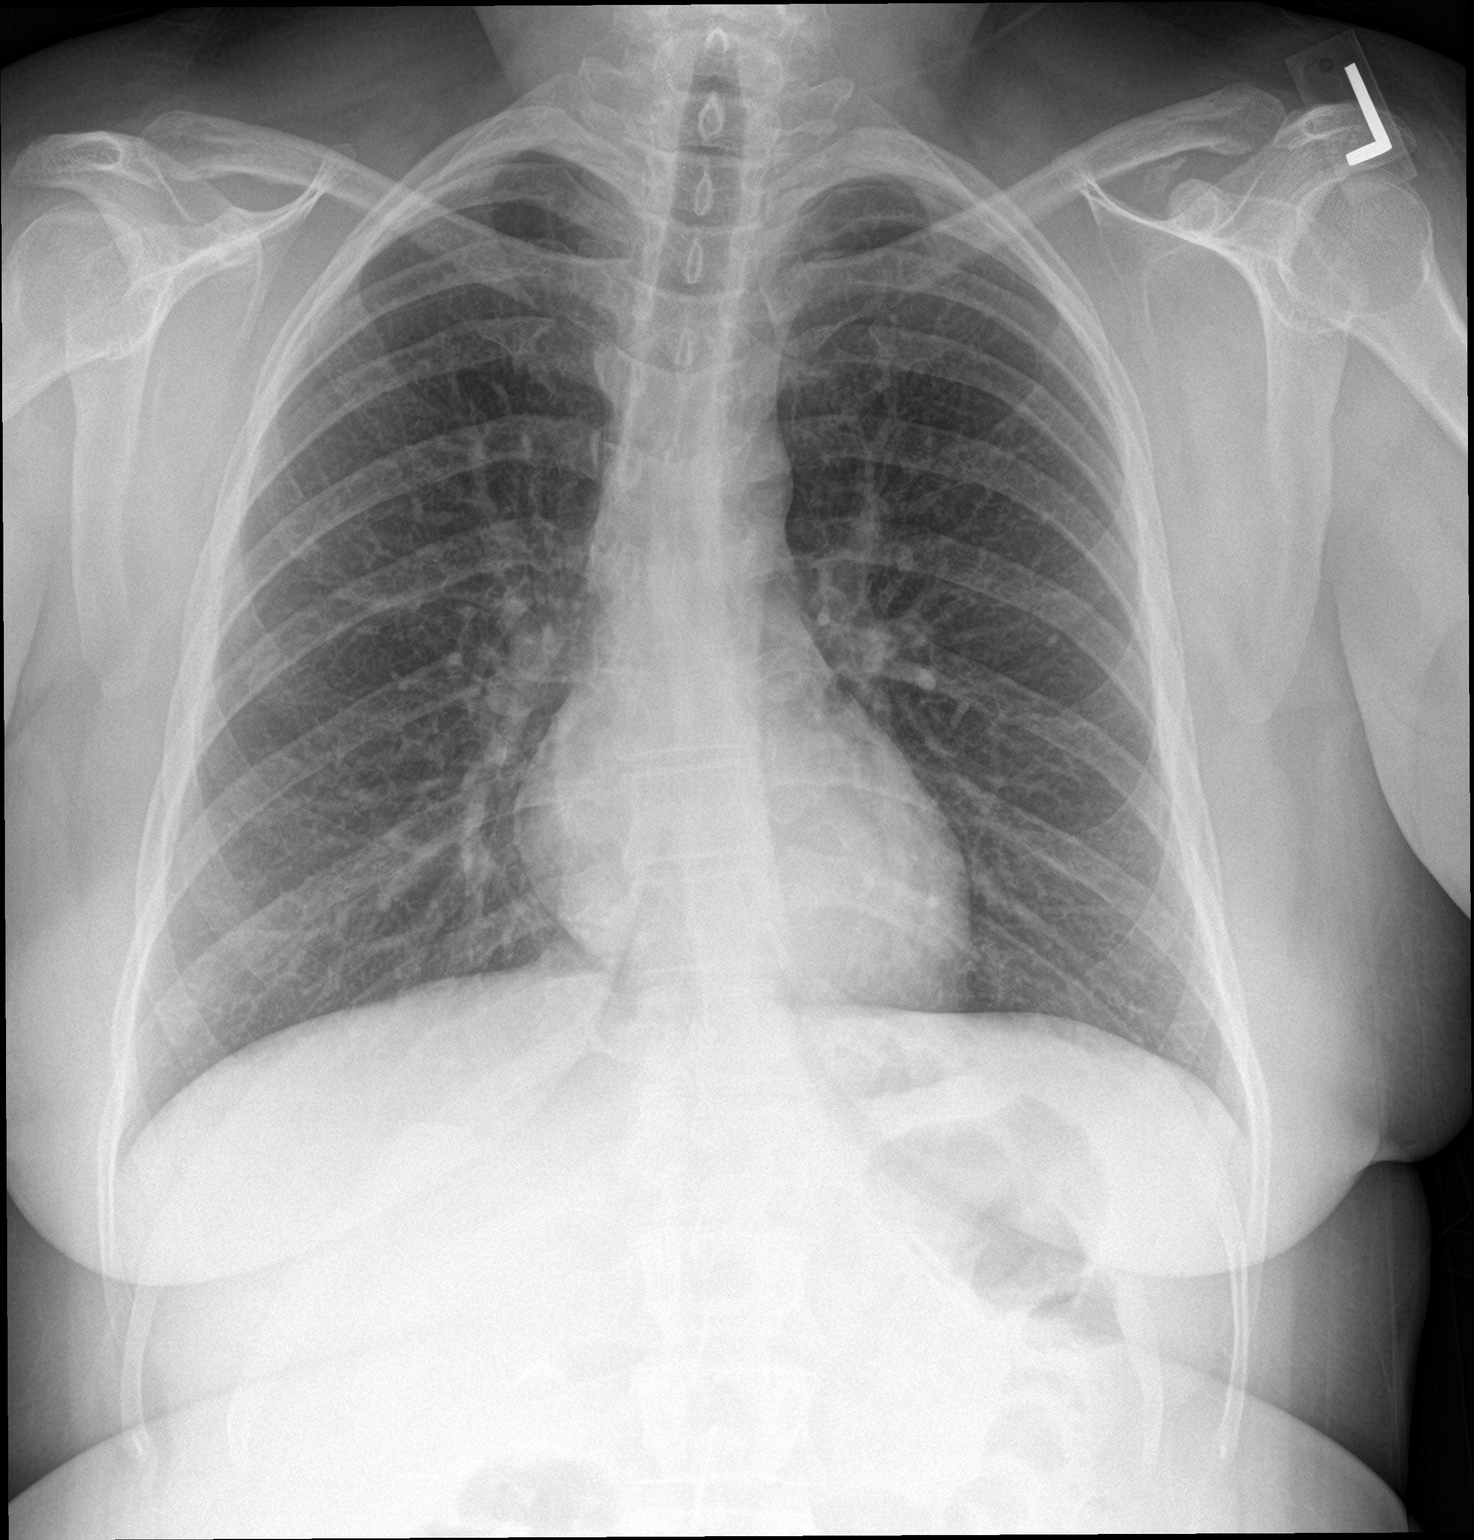

[chest lat]
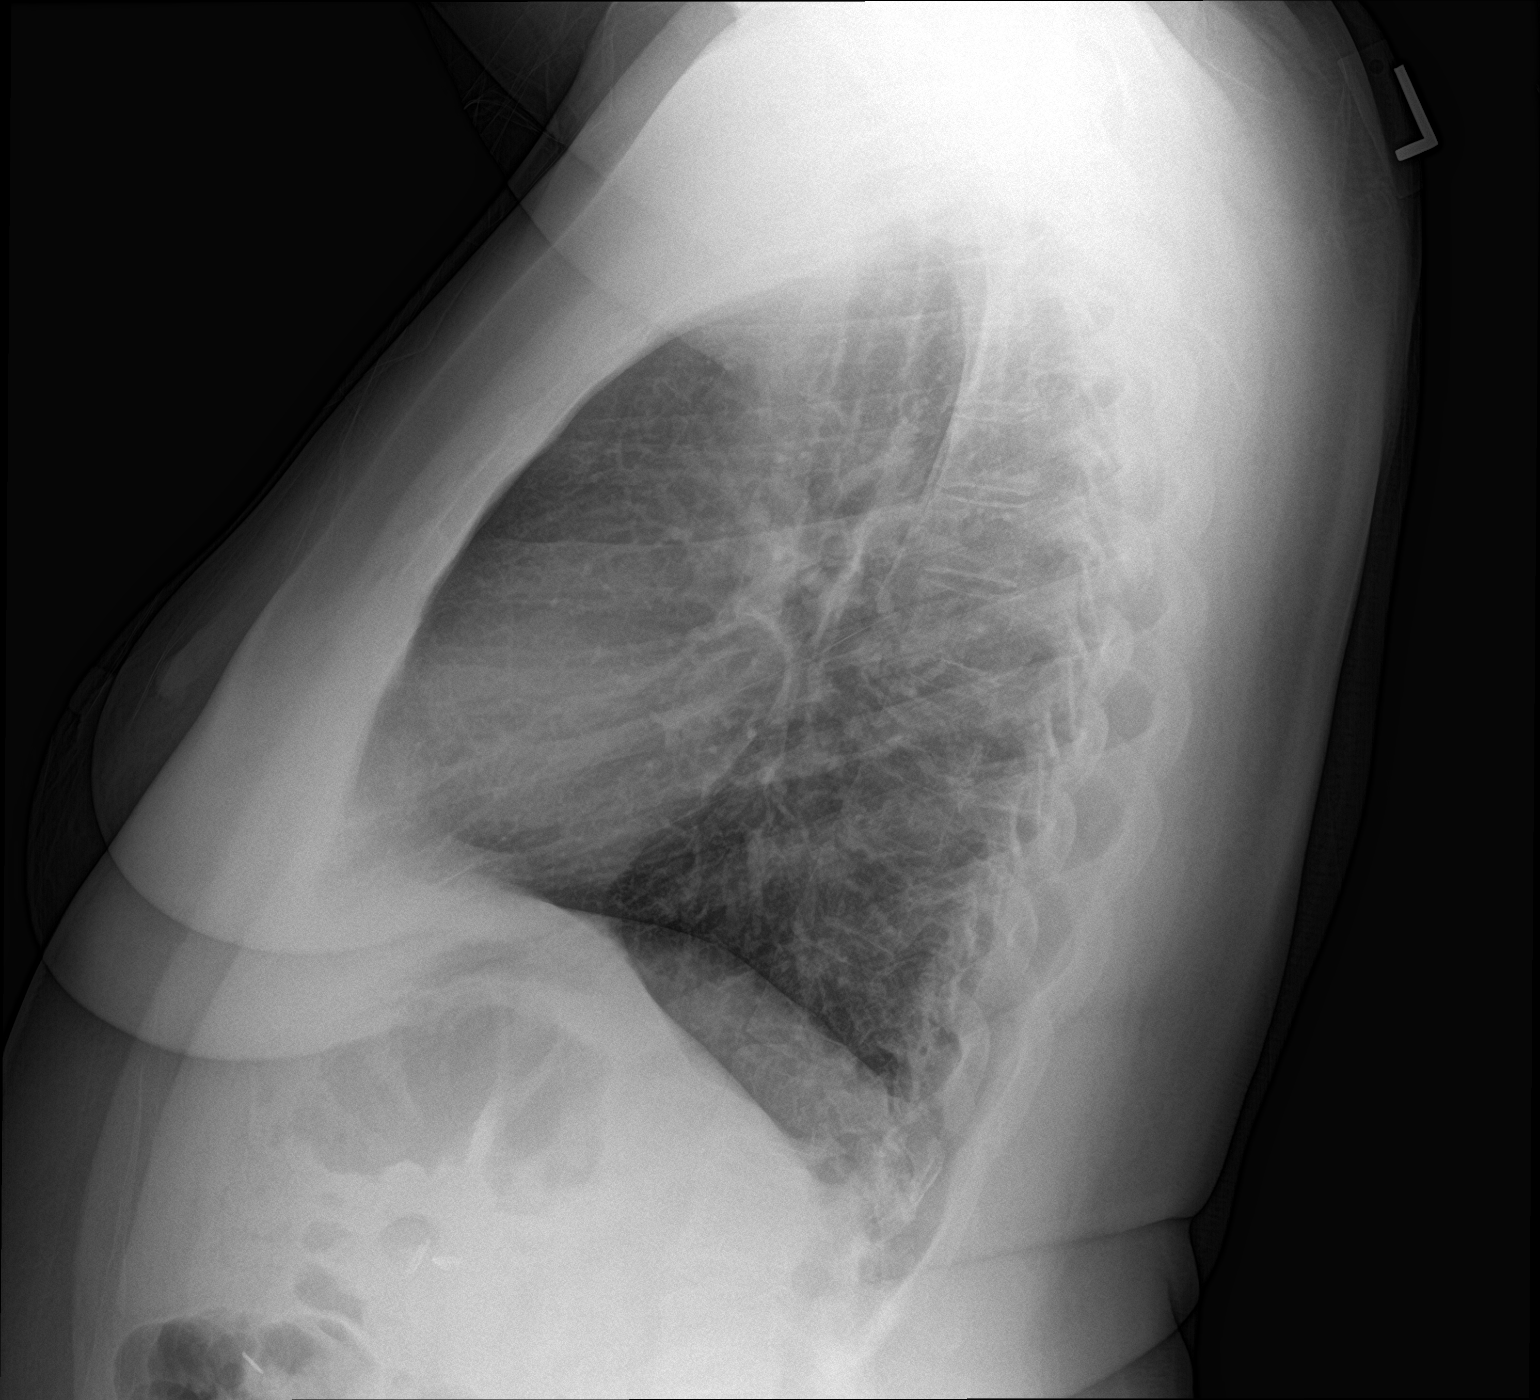

[2 of 2 positions shown; findings below may reference images not displayed]

FINDINGS: The heart size and mediastinal contours are within normal limits.
Both lungs are clear. The visualized skeletal structures are
unremarkable.
IMPRESSION: Negative two view chest x-ray

## 2021-11-02 ENCOUNTER — Emergency Department (INDEPENDENT_AMBULATORY_CARE_PROVIDER_SITE_OTHER)
Admission: EM | Admit: 2021-11-02 | Discharge: 2021-11-02 | Disposition: A | Discharge status: Home / Self Care | Payer: 59 | Source: Home / Self Care

## 2021-11-02 ENCOUNTER — Encounter: Payer: Self-pay | Admitting: Family Medicine

## 2021-11-02 ENCOUNTER — Emergency Department (INDEPENDENT_AMBULATORY_CARE_PROVIDER_SITE_OTHER): Payer: 59

## 2021-11-02 ENCOUNTER — Other Ambulatory Visit: Payer: Self-pay

## 2021-11-02 DIAGNOSIS — M7989 Other specified soft tissue disorders: Secondary | ICD-10-CM | POA: Diagnosis not present

## 2021-11-02 DIAGNOSIS — M25441 Effusion, right hand: Secondary | ICD-10-CM

## 2021-11-02 NOTE — Discharge Instructions (Addendum)
Advised/informed patient of right hand/right ring finger x-ray results with hard copy provided to patient.  Advised patient may RICE affected area of right ring finger for 25 minutes 3 times daily for the next 3 days.  Advised if symptoms worsen and/or unresolved please follow-up with PCP or here for further evaluation.

## 2021-11-02 NOTE — ED Provider Notes (Signed)
For Sierra Henderson CARE    CSN: [id number] Arrival date & time: 11/02/21  1729      History   Chief Complaint Chief Complaint  Patient presents with   finger Swelling   Finger Injury    HPI Ave Scharnhorst is a 51 y.o. female.   HPI 51 year old female presents with right hand ring finger swelling for 2.5 days.  Reports hit ring finger on the wall bending ring causing finger to swell. PMH significant for obesity, T2DM, and sleep apnea.  Past Medical History:  Diagnosis Date   Arthritis    Asthma    Cervical spinal stenosis    Diabetes mellitus without complication (HCC)    Low back pain    Nummular dermatitis    Obesity    Ovarian cyst    Sjoegren syndrome    Sleep apnea     There are no problems to display for this patient.   Past Surgical History:  Procedure Laterality Date   ADENOIDECTOMY     CESAREAN SECTION     CHOLECYSTECTOMY     TONSILLECTOMY      OB History   No obstetric history on file.      Home Medications    Prior to Admission medications   Medication Sig Start Date End Date Taking? Authorizing Provider  albuterol (PROVENTIL) (5 MG/ML) 0.5% nebulizer solution Take 2.5 mg by nebulization every 6 (six) hours as needed for wheezing or shortness of breath.    [provider]  albuterol (VENTOLIN HFA) 108 (90 Base) MCG/ACT inhaler Inhale 2 puffs into the lungs every 4 (four) hours as needed for wheezing or shortness of breath. 04/01/19   Kandra Nicolas, MD  chlorhexidine (PERIDEX) 0.12 % solution Use as directed 15 mLs in the mouth or throat 2 (two) times daily. Swish, gargle for 60 seconds, then spit. 11/19/18   Noe Gens, PA-C  gabapentin (NEURONTIN) 300 MG capsule Take 300 mg by mouth 2 (two) times daily.    [provider]  glipiZIDE (GLUCOTROL) 10 MG tablet Take 10 mg by mouth daily before breakfast.    [provider]  hydroxychloroquine (PLAQUENIL) 200 MG tablet Take by mouth 2 (two) times daily.    [provider]  levonorgestrel (MIRENA) 20 MCG/24HR IUD 1 each by Intrauterine route once.    [provider]  meloxicam (MOBIC) 7.5 MG tablet Take 7.5 mg by mouth daily.    [provider]  nystatin (MYCOSTATIN/NYSTOP) powder Apply topically 3 (three) times daily.    [provider]  pilocarpine (SALAGEN) 5 MG tablet Take by mouth 3 (three) times daily.    [provider]  tizanidine (ZANAFLEX) 2 MG capsule Take 2 mg by mouth 3 (three) times daily.    [provider]  triamcinolone ointment (KENALOG) 0.5 % Apply 1 application topically 2 (two) times daily.    [provider]    Family History Family History  Problem Relation Age of Onset   Hypertension Mother    Diabetes Mother    Hypertension Father    Diabetes Father     Social History Social History   Tobacco Use   Smoking status: Never   Smokeless tobacco: Never  Vaping Use   Vaping Use: Never used  Substance Use Topics   Alcohol use: Yes    Comment: socially   Drug use: No     Allergies   Latex, Metformin and related, and Augmentin [amoxicillin-pot clavulanate]   Review of Systems  Review of Systems  Musculoskeletal:        Right hand ring finger swelling x 2.5 days     Physical Exam Triage Vital Signs ED Triage Vitals  Enc Vitals Group     BP      Pulse      Resp      Temp      Temp src      SpO2      Weight      Height      Head Circumference      Peak Flow      Pain Score      Pain Loc      Pain Edu?      Excl. in [place]?    No data found.  Updated Vital Signs BP 106/75 (BP Location: Left Arm)   Pulse 98   Temp 99 F (37.2 C) (Oral)   Resp 16   Ht '5\' 3"'$  (1.6 m)   Wt 190 lb (86.2 kg)   SpO2 98%   BMI 33.66 kg/m      Physical Exam Vitals and nursing note reviewed.  Constitutional:      General: She is not in acute distress.    Appearance: Normal appearance. She is obese. She is not ill-appearing.  HENT:     Head: Normocephalic  and atraumatic.     Mouth/Throat:     Mouth: Mucous membranes are moist.     Pharynx: Oropharynx is clear.  Eyes:     Extraocular Movements: Extraocular movements intact.     Conjunctiva/sclera: Conjunctivae normal.     Pupils: Pupils are equal, round, and reactive to light.  Cardiovascular:     Rate and Rhythm: Normal rate and regular rhythm.     Pulses: Normal pulses.     Heart sounds: Normal heart sounds. No murmur heard. Pulmonary:     Effort: Pulmonary effort is normal.     Breath sounds: Normal breath sounds. No wheezing, rhonchi or rales.  Musculoskeletal:     Cervical back: Normal range of motion and neck supple.     Comments: Right hand (dorsal aspect of fourth/ring finger): Moderate soft tissue swelling noted over proximal phalanx, ring cut off successfully with ring cutter/wire cutter  Skin:    General: Skin is warm and dry.  Neurological:     General: No focal deficit present.     Mental Status: She is alert and oriented to person, place, and time.      UC Treatments / Results  Labs (all labs ordered are listed, but only abnormal results are displayed) Labs Reviewed - No data to display  EKG   Radiology DG Hand Complete Right  Result Date: 11/02/2021 CLINICAL DATA:  ring finger swelling EXAM: RIGHT HAND - COMPLETE 3+ VIEW COMPARISON:  None Available. FINDINGS: No acute fracture or dislocation. Suggestion of diffuse edema throughout the phalanges. No soft tissue gas or radiopaque foreign object. No osseous destruction or evidence of arthritis. IMPRESSION: No acute osseous abnormality. Electronically Signed   By: Abigail Miyamoto M.D.   On: 11/02/2021 18:19    Procedures Procedures (including critical care time)  Medications Ordered in UC Medications - No data to display  Initial Impression / Assessment and Plan / UC Course  I have reviewed the triage vital signs and the nursing notes.  Pertinent labs & imaging results that were available during my care of the  patient were reviewed by me and considered in my medical decision making (see chart  for details).     MDM: 1.  Finger swelling-right hand/right ring finger x-ray results revealed above; large silver ring cut off successfully from right ring finger today in clinic, patient tolerated procedure well. Advised/informed patient of right hand/right ring finger x-ray results with hard copy provided to patient.  Advised patient may RICE affected area of right ring finger for 25 minutes 3 times daily for the next 3 days.  Advised if symptoms worsen and/or unresolved please follow-up with PCP or here for further evaluation.  Patient discharged home, hemodynamically stable. Final Clinical Impressions(s) / UC Diagnoses   Final diagnoses:  Finger joint swelling, right     Discharge Instructions      Advised/informed patient of right hand/right ring finger x-ray results with hard copy provided to patient.  Advised patient may RICE affected area of right ring finger for 25 minutes 3 times daily for the next 3 days.  Advised if symptoms worsen and/or unresolved please follow-up with PCP or here for further evaluation.     ED Prescriptions   None    PDMP not reviewed this encounter.   Eliezer Lofts, [place] 11/02/21 1830

## 2021-11-02 NOTE — ED Triage Notes (Signed)
Hit RT ring finger on wall bending ring causing finger to swell 2.5 days ago.

## 2021-11-17 DIAGNOSIS — E119 Type 2 diabetes mellitus without complications: Secondary | ICD-10-CM | POA: Diagnosis not present

## 2021-11-17 DIAGNOSIS — Z794 Long term (current) use of insulin: Secondary | ICD-10-CM | POA: Diagnosis not present

## 2021-12-01 DIAGNOSIS — M1612 Unilateral primary osteoarthritis, left hip: Secondary | ICD-10-CM | POA: Diagnosis not present

## 2021-12-15 DIAGNOSIS — R600 Localized edema: Secondary | ICD-10-CM | POA: Diagnosis not present

## 2021-12-15 DIAGNOSIS — M94252 Chondromalacia, left hip: Secondary | ICD-10-CM | POA: Diagnosis not present

## 2021-12-15 DIAGNOSIS — M24152 Other articular cartilage disorders, left hip: Secondary | ICD-10-CM | POA: Diagnosis not present

## 2022-01-24 DIAGNOSIS — Z419 Encounter for procedure for purposes other than remedying health state, unspecified: Secondary | ICD-10-CM | POA: Diagnosis not present

## 2022-02-24 DIAGNOSIS — Z419 Encounter for procedure for purposes other than remedying health state, unspecified: Secondary | ICD-10-CM | POA: Diagnosis not present

## 2022-03-26 DIAGNOSIS — Z419 Encounter for procedure for purposes other than remedying health state, unspecified: Secondary | ICD-10-CM | POA: Diagnosis not present

## 2022-04-26 DIAGNOSIS — Z419 Encounter for procedure for purposes other than remedying health state, unspecified: Secondary | ICD-10-CM | POA: Diagnosis not present

## 2022-05-06 DIAGNOSIS — S73192A Other sprain of left hip, initial encounter: Secondary | ICD-10-CM | POA: Diagnosis not present

## 2022-05-12 DIAGNOSIS — S73192A Other sprain of left hip, initial encounter: Secondary | ICD-10-CM | POA: Diagnosis not present

## 2022-05-18 DIAGNOSIS — G8929 Other chronic pain: Secondary | ICD-10-CM | POA: Diagnosis not present

## 2022-05-18 DIAGNOSIS — M6281 Muscle weakness (generalized): Secondary | ICD-10-CM | POA: Diagnosis not present

## 2022-05-18 DIAGNOSIS — M25552 Pain in left hip: Secondary | ICD-10-CM | POA: Diagnosis not present

## 2022-05-18 DIAGNOSIS — M545 Low back pain, unspecified: Secondary | ICD-10-CM | POA: Diagnosis not present

## 2022-05-18 DIAGNOSIS — R29898 Other symptoms and signs involving the musculoskeletal system: Secondary | ICD-10-CM | POA: Diagnosis not present

## 2022-05-18 DIAGNOSIS — R2689 Other abnormalities of gait and mobility: Secondary | ICD-10-CM | POA: Diagnosis not present

## 2022-05-25 DIAGNOSIS — M6281 Muscle weakness (generalized): Secondary | ICD-10-CM | POA: Diagnosis not present

## 2022-05-25 DIAGNOSIS — M25552 Pain in left hip: Secondary | ICD-10-CM | POA: Diagnosis not present

## 2022-05-25 DIAGNOSIS — G8929 Other chronic pain: Secondary | ICD-10-CM | POA: Diagnosis not present

## 2022-05-25 DIAGNOSIS — R29898 Other symptoms and signs involving the musculoskeletal system: Secondary | ICD-10-CM | POA: Diagnosis not present

## 2022-05-25 DIAGNOSIS — R2689 Other abnormalities of gait and mobility: Secondary | ICD-10-CM | POA: Diagnosis not present

## 2022-05-25 DIAGNOSIS — M545 Low back pain, unspecified: Secondary | ICD-10-CM | POA: Diagnosis not present

## 2022-05-27 DIAGNOSIS — Z419 Encounter for procedure for purposes other than remedying health state, unspecified: Secondary | ICD-10-CM | POA: Diagnosis not present

## 2022-06-02 ENCOUNTER — Encounter: Payer: Self-pay | Admitting: Internal Medicine

## 2022-06-02 ENCOUNTER — Ambulatory Visit: Payer: Medicaid Other | Attending: Internal Medicine | Admitting: Internal Medicine

## 2022-06-02 VITALS — BP 122/80 | HR 94 | Resp 16 | Ht 63.0 in | Wt 205.0 lb

## 2022-06-02 DIAGNOSIS — M25552 Pain in left hip: Secondary | ICD-10-CM | POA: Diagnosis not present

## 2022-06-02 DIAGNOSIS — M3500 Sicca syndrome, unspecified: Secondary | ICD-10-CM | POA: Diagnosis not present

## 2022-06-02 DIAGNOSIS — E119 Type 2 diabetes mellitus without complications: Secondary | ICD-10-CM | POA: Insufficient documentation

## 2022-06-02 DIAGNOSIS — H1045 Other chronic allergic conjunctivitis: Secondary | ICD-10-CM | POA: Diagnosis not present

## 2022-06-02 DIAGNOSIS — M2550 Pain in unspecified joint: Secondary | ICD-10-CM | POA: Diagnosis not present

## 2022-06-02 DIAGNOSIS — E1165 Type 2 diabetes mellitus with hyperglycemia: Secondary | ICD-10-CM | POA: Diagnosis not present

## 2022-06-02 DIAGNOSIS — Z91199 Patient's noncompliance with other medical treatment and regimen due to unspecified reason: Secondary | ICD-10-CM | POA: Insufficient documentation

## 2022-06-02 DIAGNOSIS — M1990 Unspecified osteoarthritis, unspecified site: Secondary | ICD-10-CM | POA: Insufficient documentation

## 2022-06-02 MED ORDER — NEOMYCIN-POLYMYXIN-DEXAMETH 3.5-10000-0.1 OP SUSP: OPHTHALMIC | 0 refills | Status: DC

## 2022-06-02 MED ORDER — PILOCARPINE HCL 5 MG PO TABS: 5.0000 mg | ORAL_TABLET | Freq: Three times a day (TID) | ORAL | 1 refills | Status: DC | PRN

## 2022-06-02 NOTE — Progress Notes (Signed)
Office Visit Note  Patient: Sierra Henderson             Date of Birth: 05-09-1970           MRN: [id number]             PCP: Azell Der, MD Referring: Donzetta Sprung., MD Visit Date: 06/02/2022 Occupation: Accounting  Subjective:  New Patient (Initial Visit) (Patient states excessive eye dryness.)   History of Present Illness: Sierra Henderson is a 52 y.o. female here for primary sjogren syndrome previously treatment with [facility] on hydroxychloroquine and pilocarpine.  She was diagnosed since 2016 with symptoms of dry eyes dry mouth and arthralgias with highly positive SSA antibody titer.  She has been off of any disease specific treatment due to lack of rheumatology follow-up continues having trouble particularly with her dry eyes and joint pain in multiple areas.  More recent has worst problems in her left hip also experiences pain and stiffness in both hands and shoulders.  She experiences some swelling in her hands and difficulty tightly closing grip most often first thing in the morning.  She is on meloxicam which is partially helpful.  She had local steroid joint injection for greater trochanteric pain syndrome or trochanteric bursitis without much relief.  She was working with physical therapy did see some improvement finish this since 2 days ago no longer has any localized soreness or tenderness but gets pain while walking.  Dry eyes and mouth are chronic she was prescribed some type of eyedrop from her eye doctor but was not well covered by insurance for this.  She gets only limited relief with over-the-counter lubricating eyedrops.  She is having some increased cavities and dental problems from the chronic dry mouth uses a coconut pulling oil with some benefit.  She also has dryness affecting her nose gets nosebleeds and some blisters are ulcerated lesions. She is also had a history of rash or raised bumps on her scalp typically breakout worse when she is stressed but these are  not active recently.  No skin rashes elsewhere.  No lymphadenopathy sialadenitis reported.  She denies Raynaud's symptoms and no abnormal bruising or bleeding. She also reports perimenopausal symptoms including hot flashes.  Labs reviewed SSA > 8.0 dsDNA, SSB neg RF neg HBV neg HCV neg  Imaging reviewed 2015 MRI Cervical spine 1. Straightening and mild kyphosis of the cervical spine. This may be secondary to muscle spasm versus patient positioning, clinical correlation recommended. 2. Multilevel degenerative disc disease and facet arthropathy, as above. There is severe stenosis of the AP dimension of the spinal canal at the C4-C5 level. There is moderate stenosis of the spinal canal at the C3-C4 and C5-C6 levels. There is mild left C4-C5 neural foramen stenosis, correlate clinically with radicular symptoms.   Activities of Daily Living:  Patient reports morning stiffness for 10 minutes.   Patient Denies nocturnal pain.  Difficulty dressing/grooming: Denies Difficulty climbing stairs: Denies Difficulty getting out of chair: Denies Difficulty using hands for taps, buttons, cutlery, and/or writing: Denies  Review of Systems  Constitutional:  Positive for fatigue.  HENT:  Negative for mouth sores and mouth dryness.   Eyes:  Positive for dryness.  Respiratory:  Negative for shortness of breath.   Cardiovascular:  Negative for chest pain and palpitations.  Gastrointestinal:  Negative for blood in stool, constipation and diarrhea.  Endocrine: Negative for increased urination.  Genitourinary:  Negative for involuntary urination.  Musculoskeletal:  Positive for joint  pain, joint pain and morning stiffness. Negative for gait problem, joint swelling, myalgias, muscle weakness, muscle tenderness and myalgias.  Skin:  Negative for color change, rash, hair loss and sensitivity to sunlight.  Allergic/Immunologic: Positive for susceptible to infections.  Neurological:  Negative for dizziness and  headaches.  Hematological:  Negative for swollen glands.  Psychiatric/Behavioral:  Positive for sleep disturbance. Negative for depressed mood. The patient is not nervous/anxious.     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   Intertriginous candidiasis 11/14/2017   Uncontrolled type 2 diabetes mellitus with hyperglycemia ([place]) 11/14/2017   Vitamin D deficiency 11/14/2017   Mild intermittent asthma without complication [id number]   Class 2 severe obesity due to excess calories with serious comorbidity and body mass index (BMI) of 36.0 to 36.9 in adult ([place]) 03/04/2016   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   Obstructive sleep apnea (adult) (pediatric) 05/15/2015   Cyst of left ovary 12/05/2014   Uterine leiomyoma 12/05/2014   Sjogren syndrome, unspecified ([place]) 09/26/2014   Annual physical exam 03/13/2014   Cervical spinal stenosis 03/13/2014   Bilateral low back pain without sciatica 01/14/2014    Past Medical History:  Diagnosis Date   Arthritis    Asthma    Cervical spinal stenosis    Diabetes mellitus without complication (HCC)    Low back pain    Nummular dermatitis    Obesity    Ovarian cyst    Sjoegren syndrome    Sleep apnea     Family History  Problem Relation Age of Onset   Hypertension Mother    Diabetes Mother    Hypertension Father    Diabetes Father    Past Surgical History:  Procedure Laterality Date   ADENOIDECTOMY     CESAREAN SECTION     CHOLECYSTECTOMY     TONSILLECTOMY     Social History   Social History Narrative   Not on file    Immunization History  Administered Date(s) Administered   PPD Test 07/18/2017   Tdap 03/13/2014     Objective: Vital Signs: BP 122/80 (BP Location: Right Arm, Patient Position: Sitting, Cuff Size: Large)   Pulse 94   Resp 16   Ht '5\' 3"'$  (1.6 m)   Wt 205 lb (93 kg)   BMI 36.31 kg/m    Physical Exam Constitutional:      Appearance: She is obese.  HENT:     Nose: Nose normal.     Mouth/Throat:     Mouth: Mucous membranes are dry.     Pharynx: Oropharynx is clear. No posterior oropharyngeal erythema.  Eyes:     Comments: Slight conjunctival injection  Cardiovascular:     Rate and Rhythm: Normal rate and regular rhythm.  Pulmonary:     Effort: Pulmonary effort is normal.     Breath sounds: Normal breath sounds.  Musculoskeletal:     Right lower leg: No edema.     Left lower leg: No edema.  Lymphadenopathy:     Cervical: No cervical adenopathy.  Skin:    General: Skin is warm and dry.  Findings: No rash.     Comments: Normal-appearing nailfold capillaroscopy no digital pitting or lesions  Neurological:     Mental Status: She is alert.  Psychiatric:        Mood and Affect: Mood normal.      Musculoskeletal Exam:  Shoulders full ROM no tenderness or swelling Elbows full ROM no tenderness or swelling Wrists full ROM no tenderness or swelling Fingers full ROM no tenderness or swelling Paraspinal muscle soreness at base of the neck pain across trapezius and upper back Hip normal internal and external rotation without pain, no tenderness to lateral hip palpation Knees full ROM no tenderness or swelling patellofemoral crepitus present   Investigation: No additional findings.  Imaging: No results found.  Recent Labs: No results found for: "WBC", "HGB", "PLT", "NA", "K", "CL", "CO2", "GLUCOSE", "BUN", "CREATININE", "BILITOT", "ALKPHOS", "AST", "ALT", "PROT", "ALBUMIN", "CALCIUM", "GFRAA", "QFTBGOLD", "QFTBGOLDPLUS"  Speciality Comments: No specialty comments  available.  Procedures:  No procedures performed Allergies: Propofol, Latex, Metformin and related, and Augmentin [amoxicillin-pot clavulanate]   Assessment / Plan:     Visit Diagnoses: Sjogren syndrome, unspecified ([place]) - Plan: Sedimentation rate, C-reactive protein, CBC with Differential/Platelet, Rheumatoid factor, C3 and C4, ANA, pilocarpine (SALAGEN) 5 MG tablet  Sjogren's syndrome with dry eyes and mouth and arthralgias not much obvious inflammatory change on exam today.  Will check serum inflammatory markers also rheumatoid factor serum complements and ANA assessing for systemic disease activity.  Does not have definite indication for resuming immunomodulatory treatment if labs all normal would monitor with symptomatic management.  Pilocarpine 5 mg up to 3 times daily for symptom management.   Uncontrolled type 2 diabetes mellitus with hyperglycemia (HCC)  Intertriginous candidiasis most likely contributed to by this could also be worsening dry mouth symptoms.  Pain in left hip Arthralgia, unspecified joint  No significant localized pain or abnormal range of motion on exam today.  No peripheral joint synovitis appreciated.  Other chronic allergic conjunctivitis of both eyes - Plan: neomycin-polymyxin b-dexamethasone (MAXITROL) 3.5-10000-0.1 SUSP  Apparently had good relief with prior use of Maxitrol drops from prescription history will resume this initially for eye irritation.  Orders: Orders Placed This Encounter  Procedures   Sedimentation rate   C-reactive protein   CBC with Differential/Platelet   Rheumatoid factor   C3 and C4   ANA   Meds ordered this encounter  Medications   pilocarpine (SALAGEN) 5 MG tablet    Sig: Take 1 tablet (5 mg total) by mouth 3 (three) times daily as needed.    Dispense:  90 tablet    Refill:  1   neomycin-polymyxin b-dexamethasone (MAXITROL) 3.5-10000-0.1 SUSP    Sig: Place two drops into both eyes 4 (four) times daily.    Dispense:   5 mL    Refill:  0     Follow-Up Instructions: Return in about 4 weeks (around 06/30/2022) for New pt pSS f/u 4wks.   Collier Salina, MD  Note - This record has been created using [employer].  Chart creation errors have been sought, but may not always  have been located. Such creation errors do not reflect on  the standard of medical care.

## 2022-06-04 LAB — CBC WITH DIFFERENTIAL/PLATELET
Absolute Monocytes: 528 cells/uL (ref 200–950)
Basophils Absolute: 24 cells/uL (ref 0–200)
Basophils Relative: 0.3 %
Eosinophils Absolute: 112 cells/uL (ref 15–500)
Eosinophils Relative: 1.4 %
HCT: 44.6 % (ref 35.0–45.0)
Hemoglobin: 14.7 g/dL (ref 11.7–15.5)
Lymphs Abs: 1768 cells/uL (ref 850–3900)
MCH: 25 pg — ABNORMAL LOW (ref 27.0–33.0)
MCHC: 33 g/dL (ref 32.0–36.0)
MCV: 76 fL — ABNORMAL LOW (ref 80.0–100.0)
MPV: 11.1 fL (ref 7.5–12.5)
Monocytes Relative: 6.6 %
Neutro Abs: 5568 cells/uL (ref 1500–7800)
Neutrophils Relative %: 69.6 %
Platelets: 223 10*3/uL (ref 140–400)
RBC: 5.87 10*6/uL — ABNORMAL HIGH (ref 3.80–5.10)
RDW: 13.4 % (ref 11.0–15.0)
Total Lymphocyte: 22.1 %
WBC: 8 10*3/uL (ref 3.8–10.8)

## 2022-06-04 LAB — SEDIMENTATION RATE: Sed Rate: 9 mm/h (ref 0–20)

## 2022-06-04 LAB — ANTI-NUCLEAR AB-TITER (ANA TITER): ANA Titer 1: 1:80 {titer} — ABNORMAL HIGH

## 2022-06-04 LAB — C3 AND C4
C3 Complement: 145 mg/dL (ref 83–193)
C4 Complement: 39 mg/dL (ref 15–57)

## 2022-06-04 LAB — RHEUMATOID FACTOR: Rheumatoid fact SerPl-aCnc: <14 IU/mL (ref <14)

## 2022-06-04 LAB — C-REACTIVE PROTEIN: CRP: 6.3 mg/L (ref <8.0)

## 2022-06-04 LAB — ANA: Anti Nuclear Antibody (ANA): POSITIVE — AB

## 2022-06-11 DIAGNOSIS — M47816 Spondylosis without myelopathy or radiculopathy, lumbar region: Secondary | ICD-10-CM | POA: Diagnosis not present

## 2022-06-11 DIAGNOSIS — M5416 Radiculopathy, lumbar region: Secondary | ICD-10-CM | POA: Diagnosis not present

## 2022-06-25 DIAGNOSIS — Z419 Encounter for procedure for purposes other than remedying health state, unspecified: Secondary | ICD-10-CM | POA: Diagnosis not present

## 2022-07-19 ENCOUNTER — Encounter: Payer: Self-pay | Admitting: Emergency Medicine

## 2022-07-19 ENCOUNTER — Ambulatory Visit (INDEPENDENT_AMBULATORY_CARE_PROVIDER_SITE_OTHER): Payer: No Typology Code available for payment source

## 2022-07-19 ENCOUNTER — Ambulatory Visit
Admission: EM | Admit: 2022-07-19 | Discharge: 2022-07-19 | Disposition: A | Discharge status: Home / Self Care | Payer: No Typology Code available for payment source | Attending: Family Medicine | Admitting: Family Medicine

## 2022-07-19 DIAGNOSIS — J209 Acute bronchitis, unspecified: Secondary | ICD-10-CM | POA: Diagnosis not present

## 2022-07-19 DIAGNOSIS — R059 Cough, unspecified: Secondary | ICD-10-CM | POA: Diagnosis not present

## 2022-07-19 DIAGNOSIS — R0602 Shortness of breath: Secondary | ICD-10-CM

## 2022-07-19 DIAGNOSIS — J4521 Mild intermittent asthma with (acute) exacerbation: Secondary | ICD-10-CM | POA: Diagnosis not present

## 2022-07-19 DIAGNOSIS — R079 Chest pain, unspecified: Secondary | ICD-10-CM

## 2022-07-19 MED ORDER — DOXYCYCLINE HYCLATE 100 MG PO CAPS: 100.0000 mg | ORAL_CAPSULE | Freq: Two times a day (BID) | ORAL | 0 refills | Status: DC

## 2022-07-19 MED ORDER — PREDNISONE 20 MG PO TABS: 20.0000 mg | ORAL_TABLET | Freq: Two times a day (BID) | ORAL | 0 refills | Status: DC

## 2022-07-19 MED ORDER — ALBUTEROL SULFATE HFA 108 (90 BASE) MCG/ACT IN AERS: 2.0000 | INHALATION_SPRAY | RESPIRATORY_TRACT | 0 refills | Status: AC | PRN

## 2022-07-19 NOTE — ED Provider Notes (Signed)
[facility]    CSN: [id number] Arrival date & time: 07/19/22  1803      History   Chief Complaint Chief Complaint  Patient presents with   Cough    HPI Daiya Pruitte is a 52 y.o. female.   HPI  This is a 52 year old woman with asthma.  Here with respiratory complaints.  She states that this has happened twice in the last 2 weeks.  Most recently last night.  She has coughing and chest pain but her husband woke her up with a concern for an unusual breathing noise.  He could hear a rattling in her chest.  He took a audio recording of this for me to listen to him.  Sounds like rhonchi with exhalation.  Patient has multiple medical problems and is quite complicated.  She is on a list of over 30 medicine s.  Believe many of these must be duplicates because she is on ibuprofen, naproxen, and meloxicam.  Is on 4 different steroid creams and ointments.  Has insulin-dependent diabetes as well as Sjogren's syndrome ,multiple orthopedic and arthralgias, noncompliant with CPAP.  Sees multiple specialists.  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   Lumbar hernia 07/14/2020   Lipoma of torso 06/16/2020   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   Intertriginous candidiasis 11/14/2017   Uncontrolled type 2 diabetes mellitus with hyperglycemia ([place]) 11/14/2017   Vitamin D deficiency 11/14/2017   Mild intermittent asthma without complication  [id number]   Class 2 severe obesity due to excess calories with serious comorbidity and body mass index (BMI) of 36.0 to 36.9 in adult ([place]) 03/04/2016   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      Home Medications    Prior to Admission medications   Medication Sig Start Date End Date Taking? Authorizing Provider  doxycycline (VIBRAMYCIN) 100 MG capsule Take 1 capsule (100 mg total) by mouth 2 (two) times daily. 07/19/22  Yes Raylene Everts, MD  predniSONE (DELTASONE) 20 MG tablet Take 1 tablet (20 mg total) by mouth 2 (two) times daily with a meal. 07/19/22  Yes Raylene Everts, MD  Accu-Chek FastClix Lancets MISC See admin instructions. 05/12/21   [provider]  albuterol (VENTOLIN HFA) 108 (90 Base) MCG/ACT inhaler Inhale 2 puffs into the lungs every 4 (four) hours as needed for wheezing or  shortness of breath. 07/19/22   Raylene Everts, MD  atorvastatin (LIPITOR) 10 MG tablet Take 1 tablet by mouth at bedtime. 06/05/18   [provider]  BD PEN NEEDLE MICRO U/F 32G X 6 MM MISC SMARTSIG:1 Injection Daily 04/20/22   [provider]  betamethasone valerate ointment (VALISONE) 0.1 % Apply to affected areas twice daily as needed for drug rash 07/10/18   [provider]  Cholecalciferol (VITAMIN D3) 1.25 MG (50000 UT) CAPS Take 1 capsule by mouth once a week.    [provider]  clobetasol ointment (TEMOVATE) 0.05 % Apply topically. 10/28/20   [provider]   diclofenac Sodium (VOLTAREN) 1 % GEL APPLY   TOPICALLY TO AFFECTED AREA 4 TIMES DAILY AS NEEDED 01/05/22   [provider]  dicyclomine (BENTYL) 20 MG tablet Take by mouth. 05/23/20   [provider]  gabapentin (NEURONTIN) 300 MG capsule Take 300 mg by mouth 2 (two) times daily.    [provider]  glipiZIDE (GLUCOTROL) 10 MG tablet Take 10 mg by mouth daily before breakfast.    [provider]  glucose blood (ACCU-CHEK GUIDE) test strip USE 1 STRIP TO [place] DAILY 03/08/22   [provider]  hydrocortisone 2.5 % cream Apply topically. 06/09/20   [provider]  hydroxychloroquine (PLAQUENIL) 200 MG tablet Take by mouth 2 (two) times daily.    [provider]  ibuprofen (ADVIL) 800 MG tablet Take by mouth. 04/23/21   [provider]  Insulin Pen Needle (RAYA SURE PEN NEEDLE) 31G X 6 MM MISC USE 1 PEN NEEDLE ONCE DAILY, AS DIRECTED. 04/14/21   [provider]  JARDIANCE 25 MG TABS tablet Take by mouth. 09/18/20 05/05/23  [provider]  LEVEMIR FLEXPEN 100 UNIT/ML FlexPen SMARTSIG:18 Unit(s) SUB-Q Every Evening    [provider]  levonorgestrel (MIRENA) 20 MCG/24HR IUD 1 each by Intrauterine route once.    [provider]  meloxicam (MOBIC) 7.5 MG tablet Take 7.5 mg by mouth daily.    [provider]  naproxen (NAPROSYN) 500 MG tablet Take 1 tablet by mouth 2 (two) times daily with a meal. 05/05/22   [provider]  norethindrone (AYGESTIN) 5 MG tablet Take by mouth. 05/16/19   [provider]  nystatin (MYCOSTATIN/NYSTOP) powder Apply topically 3 (three) times daily.    [provider]  pantoprazole (PROTONIX) 40 MG tablet Take 1 tablet by mouth daily. 05/23/20   [provider]  pilocarpine (SALAGEN) 5 MG tablet Take 1 tablet (5 mg total) by mouth 3 (three) times daily as needed. 06/02/22   Rice, Resa Miner, MD  polyethylene  glycol powder (GLYCOLAX/MIRALAX) 17 GM/SCOOP powder Take by mouth. 06/25/20   [provider]  sitaGLIPtin (JANUVIA) 50 MG tablet Take 1 tablet by mouth daily. 10/13/20   [provider]  sucralfate (CARAFATE) 1 GM/10ML suspension Take by mouth. 09/11/18   [provider]  tizanidine (ZANAFLEX) 2 MG capsule Take 2 mg by mouth 3 (three) times daily.    [provider]  traMADol (ULTRAM) 50 MG tablet Take by mouth.    [provider]  triamcinolone ointment (KENALOG) 0.5 % Apply 1 application topically 2 (two) times daily.    [provider]    Family History Family History  Problem Relation Age of Onset   Hypertension Mother    Diabetes Mother    Hypertension Father    Diabetes Father     Social  History Social History   Tobacco Use   Smoking status: Never    Passive exposure: Past   Smokeless tobacco: Never  Vaping Use   Vaping Use: Never used  Substance Use Topics   Alcohol use: Yes    Comment: socially   Drug use: No     Allergies   Propofol, Latex, Metformin and related, and Augmentin [amoxicillin-pot clavulanate]   Review of Systems Review of Systems See HPI  Physical Exam Triage Vital Signs ED Triage Vitals  Enc Vitals Group     BP 07/19/22 1816 125/85     Pulse Rate 07/19/22 1816 99     Resp --      Temp 07/19/22 1816 99 F (37.2 C)     Temp Source 07/19/22 1816 Oral     SpO2 07/19/22 1816 98 %     Weight --      Height --      Head Circumference --      Peak Flow --      Pain Score 07/19/22 1815 7     Pain Loc --      Pain Edu? --      Excl. in [place]? --    No data found.  Updated Vital Signs BP 125/85 (BP Location: Left Arm)   Pulse 99   Temp 99 F (37.2 C) (Oral)   Resp (!) 22   LMP 07/17/2022   SpO2 98%      Physical Exam Constitutional:      General: She is not in acute distress.    Appearance: She is well-developed. She is obese. She is not ill-appearing.  HENT:     Head:  Normocephalic and atraumatic.     Right Ear: Tympanic membrane and ear canal normal.     Left Ear: Tympanic membrane and ear canal normal.     Nose: Nose normal. No congestion.     Mouth/Throat:     Mouth: Mucous membranes are moist.     Pharynx: No posterior oropharyngeal erythema.  Eyes:     Conjunctiva/sclera: Conjunctivae normal.     Pupils: Pupils are equal, round, and reactive to light.  Cardiovascular:     Rate and Rhythm: Normal rate and regular rhythm.     Heart sounds: Normal heart sounds.  Pulmonary:     Effort: Pulmonary effort is normal. No respiratory distress.     Breath sounds: Normal breath sounds. No wheezing or rhonchi.  Abdominal:     General: There is no distension.     Palpations: Abdomen is soft.  Musculoskeletal:        General: Normal range of motion.     Cervical back: Normal range of motion.  Lymphadenopathy:     Cervical: No cervical adenopathy.  Skin:    General: Skin is warm and dry.  Neurological:     Mental Status: She is alert.      UC Treatments / Results  Labs (all labs ordered are listed, but only abnormal results are displayed) Labs Reviewed - No data to display  EKG   Radiology DG Chest 2 View  Result Date: 07/19/2022 CLINICAL DATA:  Cough, shortness of breath, chest pain EXAM: CHEST - 2 VIEW COMPARISON:  Chest x-ray 07/22/2019. FINDINGS: The heart and mediastinal contours are within normal limits. No focal consolidation. No pulmonary edema. No pleural effusion. No pneumothorax. No acute osseous abnormality. Right upper quadrant surgical clips. IMPRESSION: No active cardiopulmonary disease. Electronically Signed   By: Clelia Croft.D.  On: 07/19/2022 18:55    Procedures Procedures (including critical care time)  Medications Ordered in UC Medications - No data to display  Initial Impression / Assessment and Plan / UC Course  I have reviewed the triage vital signs and the nursing notes.  Pertinent labs & imaging results  that were available during my care of the patient were reviewed by me and considered in my medical decision making (see chart for details).     Patient had a cough for 2 weeks with chest pain, congestion, no sputum production.  History of underlying asthma.  Will treat with antibiotics and prednisone. Final Clinical Impressions(s) / UC Diagnoses   Final diagnoses:  Acute bronchitis, unspecified organism  Mild intermittent asthma with acute exacerbation     Discharge Instructions      Drink lots of fluids Put a humidifier in your bedroom if there is 1 available Take the antibiotic as directed Use your inhaler if needed Take prednisone 2 times a day for 5 days.  Check your sugar frequently.  If your sugar goes above 250, stop the prednisone     ED Prescriptions     Medication Sig Dispense Auth. Provider   predniSONE (DELTASONE) 20 MG tablet Take 1 tablet (20 mg total) by mouth 2 (two) times daily with a meal. 10 tablet Raylene Everts, MD   doxycycline (VIBRAMYCIN) 100 MG capsule Take 1 capsule (100 mg total) by mouth 2 (two) times daily. 14 capsule Raylene Everts, MD   albuterol (VENTOLIN HFA) 108 (90 Base) MCG/ACT inhaler Inhale 2 puffs into the lungs every 4 (four) hours as needed for wheezing or shortness of breath. 8 g Raylene Everts, MD      PDMP not reviewed this encounter.   Raylene Everts, MD 07/19/22 [phone]

## 2022-07-19 NOTE — ED Triage Notes (Signed)
Patient presents to [facility] for evaluation of cough and wheezing episodes occurring in the middle of the night that are waking her up  Have been intermittent for the past two weeks.  States one happened last night, she has a recording for the provider to listen to of an episode

## 2022-07-19 NOTE — Discharge Instructions (Signed)
Drink lots of fluids Put a humidifier in your bedroom if there is 1 available Take the antibiotic as directed Use your inhaler if needed Take prednisone 2 times a day for 5 days.  Check your sugar frequently.  If your sugar goes above 250, stop the prednisone

## 2022-07-26 DIAGNOSIS — Z419 Encounter for procedure for purposes other than remedying health state, unspecified: Secondary | ICD-10-CM | POA: Diagnosis not present

## 2022-07-30 DIAGNOSIS — N6321 Unspecified lump in the left breast, upper outer quadrant: Secondary | ICD-10-CM | POA: Diagnosis not present

## 2022-08-25 DIAGNOSIS — Z419 Encounter for procedure for purposes other than remedying health state, unspecified: Secondary | ICD-10-CM | POA: Diagnosis not present

## 2022-09-25 DIAGNOSIS — Z419 Encounter for procedure for purposes other than remedying health state, unspecified: Secondary | ICD-10-CM | POA: Diagnosis not present

## 2022-10-06 ENCOUNTER — Ambulatory Visit
Admission: EM | Admit: 2022-10-06 | Discharge: 2022-10-06 | Disposition: A | Discharge status: Home / Self Care | Payer: No Typology Code available for payment source | Attending: Family Medicine | Admitting: Family Medicine

## 2022-10-06 ENCOUNTER — Encounter: Payer: Self-pay | Admitting: Emergency Medicine

## 2022-10-06 DIAGNOSIS — H00025 Hordeolum internum left lower eyelid: Secondary | ICD-10-CM | POA: Diagnosis not present

## 2022-10-06 MED ORDER — DOXYCYCLINE HYCLATE 100 MG PO CAPS: 100.0000 mg | ORAL_CAPSULE | Freq: Two times a day (BID) | ORAL | 0 refills | Status: AC

## 2022-10-06 NOTE — ED Triage Notes (Signed)
Patient c/o small white bumps inside of left bottom eyelid since yesterday.  The eye is itchy and sore.  No injury to the eye.  Denies any OTC eye medications.

## 2022-10-06 NOTE — ED Provider Notes (Signed)
[facility]    CSN: [id number] Arrival date & time: 10/06/22  1839      History   Chief Complaint Chief Complaint  Patient presents with   Eye Problem    HPI Sierra Henderson is a 52 y.o. female.   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   Lumbar hernia 07/14/2020   Lipoma of torso 06/16/2020   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   Intertriginous candidiasis 11/14/2017   Uncontrolled type 2 diabetes mellitus with hyperglycemia ([facility]) 11/14/2017   Vitamin D deficiency 11/14/2017   Mild intermittent asthma without complication 04/05/2016   Class 2 severe obesity due to excess calories with serious comorbidity and body mass index (BMI) of 36.0 to 36.9 in adult ([facility]) 03/04/2016   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      Home Medications    Prior to Admission medications   Medication Sig Start Date End Date Taking? Authorizing Provider   Accu-Chek FastClix Lancets MISC See admin instructions. 05/12/21  Yes [provider]  albuterol (VENTOLIN HFA) 108 (90 Base) MCG/ACT inhaler Inhale 2 puffs into the lungs every 4 (four) hours as needed for wheezing or shortness of breath. 07/19/22  Yes Eustace Moore, MD  atorvastatin (LIPITOR) 10 MG tablet Take 1 tablet by mouth at bedtime. 06/05/18  Yes [provider]  BD PEN NEEDLE MICRO U/F 32G X 6 MM MISC SMARTSIG:1 Injection Daily 04/20/22  Yes [provider]  betamethasone valerate ointment (VALISONE) 0.1 % Apply to affected areas twice daily as needed for drug rash 07/10/18  Yes [provider]  Cholecalciferol (VITAMIN D3) 1.25 MG (50000 UT) CAPS Take 1 capsule by mouth once a week.   Yes [provider]  clobetasol ointment (TEMOVATE) 0.05 % Apply topically. 10/28/20  Yes [provider]  diclofenac Sodium (VOLTAREN) 1 % GEL APPLY   TOPICALLY TO AFFECTED AREA 4 TIMES DAILY AS NEEDED 01/05/22  Yes [provider]  dicyclomine (BENTYL) 20 MG tablet Take by mouth. 05/23/20  Yes [provider]  doxycycline (VIBRAMYCIN) 100 MG capsule Take 1 capsule (100 mg total) by mouth 2 (two) times daily for 7 days. 10/06/22 10/13/22 Yes Trevor Iha, FNP  gabapentin (NEURONTIN) 300 MG capsule Take 300 mg by mouth 2 (two) times daily.   Yes [provider]  glipiZIDE (GLUCOTROL) 10 MG tablet Take 10 mg by mouth daily before breakfast.   Yes [provider]  glucose blood (ACCU-CHEK GUIDE) test strip USE 1 STRIP TO CHECK GLUCOSE THREE TIMES DAILY 03/08/22  Yes [provider]  hydrocortisone 2.5 % cream Apply topically. 06/09/20  Yes [provider]  hydroxychloroquine (PLAQUENIL) 200 MG tablet Take by mouth 2 (two) times daily.   Yes [provider]  ibuprofen (ADVIL) 800 MG tablet Take by mouth. 04/23/21  Yes [provider]  Insulin Pen Needle (RAYA SURE PEN NEEDLE) 31G X 6 MM  MISC USE 1 PEN NEEDLE ONCE DAILY, AS DIRECTED. 04/14/21  Yes [provider]  JARDIANCE 25 MG TABS tablet Take by mouth. 09/18/20 05/05/23 Yes [provider]  LEVEMIR FLEXPEN 100 UNIT/ML FlexPen SMARTSIG:18 Unit(s) SUB-Q Every Evening   Yes [provider]  levonorgestrel (MIRENA) 20 MCG/24HR IUD 1 each by Intrauterine route once.   Yes [provider]  meloxicam (MOBIC) 7.5 MG tablet Take 7.5 mg by mouth daily.   Yes [provider]  naproxen (NAPROSYN) 500 MG tablet Take 1 tablet by mouth 2 (two) times daily with a meal. 05/05/22  Yes [provider]  norethindrone (AYGESTIN) 5 MG tablet Take by mouth. 05/16/19  Yes [provider]  nystatin (MYCOSTATIN/NYSTOP) powder Apply topically 3 (three) times daily.   Yes [provider]  pantoprazole (PROTONIX) 40 MG tablet Take 1 tablet by mouth daily. 05/23/20  Yes [provider]  pilocarpine (SALAGEN) 5 MG tablet Take 1 tablet (5 mg total) by mouth 3 (three) times daily as needed. 06/02/22  Yes Rice, Jamesetta Orleans, MD  polyethylene glycol powder (GLYCOLAX/MIRALAX) 17 GM/SCOOP powder Take by mouth. 06/25/20  Yes [provider]  predniSONE (DELTASONE) 20 MG tablet Take 1 tablet (20 mg total) by mouth 2 (two) times daily with a meal. 07/19/22  Yes Eustace Moore, MD  sitaGLIPtin (JANUVIA) 50 MG tablet Take 1 tablet by mouth daily. 10/13/20  Yes [provider]  sucralfate (CARAFATE) 1 GM/10ML suspension Take by mouth. 09/11/18  Yes [provider]  tizanidine (ZANAFLEX) 2 MG capsule Take 2 mg by mouth 3 (three) times daily.   Yes [provider]  traMADol (ULTRAM) 50 MG tablet Take by mouth.   Yes [provider]  triamcinolone ointment (KENALOG) 0.5 % Apply 1 application topically 2 (two) times daily.   Yes [provider]    Family History Family History  Problem Relation Age of Onset   Hypertension Mother    Diabetes  Mother    Hypertension Father    Diabetes Father     Social History Social History   Tobacco Use   Smoking status: Never    Passive exposure: Past   Smokeless tobacco: Never  Vaping Use   Vaping Use: Never used  Substance Use Topics   Alcohol use: Yes    Comment: socially   Drug use: No     Allergies   Propofol, Latex, Metformin and related, and Augmentin [amoxicillin-pot clavulanate]   Review of Systems Review of Systems   Physical Exam Triage Vital Signs ED Triage Vitals  Enc Vitals Group     BP 10/06/22 1853 109/76     Pulse Rate 10/06/22 1853 84     Resp 10/06/22 1853 18     Temp 10/06/22 1853 98.4 F (36.9 C)     Temp Source 10/06/22 1853 Oral     SpO2 10/06/22 1853 97 %  Weight --      Height --      Head Circumference --      Peak Flow --      Pain Score 10/06/22 1855 2     Pain Loc --      Pain Edu? --      Excl. in [place]? --    No data found.  Updated Vital Signs BP 109/76 (BP Location: Right Arm)   Pulse 84   Temp 98.4 F (36.9 C) (Oral)   Resp 18   SpO2 97%   Visual Acuity Right Eye Distance:   Left Eye Distance:   Bilateral Distance:    Right Eye Near:   Left Eye Near:    Bilateral Near:     Physical Exam Vitals and nursing note reviewed.  Constitutional:      Appearance: Normal appearance. She is normal weight.  HENT:     Head: Normocephalic and atraumatic.     Nose: Nose normal.     Mouth/Throat:     Mouth: Mucous membranes are moist.     Pharynx: Oropharynx is clear.  Eyes:     Extraocular Movements: Extraocular movements intact.     Conjunctiva/sclera: Conjunctivae normal.     Pupils: Pupils are equal, round, and reactive to light.     Comments: Left lower eyelid: Tiny erythematous (~ 3 mm ovoid shaped) hordeolum internum evident/noted  Cardiovascular:     Rate and Rhythm: Normal rate and regular rhythm.     Pulses: Normal pulses.     Heart sounds: Normal heart sounds.  Pulmonary:     Effort: Pulmonary effort is  normal.     Breath sounds: Normal breath sounds. No wheezing, rhonchi or rales.  Musculoskeletal:        General: Normal range of motion.     Cervical back: Normal range of motion and neck supple.  Skin:    General: Skin is warm and dry.  Neurological:     General: No focal deficit present.     Mental Status: She is alert and oriented to person, place, and time. Mental status is at baseline.  Psychiatric:        Mood and Affect: Mood normal.        Behavior: Behavior normal.      UC Treatments / Results  Labs (all labs ordered are listed, but only abnormal results are displayed) Labs Reviewed - No data to display  EKG   Radiology No results found.  Procedures Procedures (including critical care time)  Medications Ordered in UC Medications - No data to display  Initial Impression / Assessment and Plan / UC Course  I have reviewed the triage vital signs and the nursing notes.  Pertinent labs & imaging results that were available during my care of the patient were reviewed by me and considered in my medical decision making (see chart for details).     MDM: 1.  Hordeolum internum of left lower eyelid-Rx'd Doxycycline 100 mg twice daily x 7 days. Instructed patient to take medication as directed with food to completion.  Encouraged increase daily water intake to 64 ounces per day while taking this medication.  Advised if symptoms worsen and/or unresolved please follow-up with PCP, optometry/ophthalmology, or here for further evaluation.  Patient discharged home, hemodynamically stable.   Final Clinical Impressions(s) / UC Diagnoses   Final diagnoses:  Hordeolum internum of left lower eyelid     Discharge Instructions      Instructed patient to  take medication as directed with food to completion.  Encouraged increase daily water intake to 64 ounces per day while taking this medication.  Advised if symptoms worsen and/or unresolved please follow-up with PCP,  optometry/ophthalmology, or here for further evaluation.     ED Prescriptions     Medication Sig Dispense Auth. Provider   doxycycline (VIBRAMYCIN) 100 MG capsule Take 1 capsule (100 mg total) by mouth 2 (two) times daily for 7 days. 14 capsule Trevor Iha, FNP      PDMP not reviewed this encounter.   Trevor Iha, FNP 10/06/22 1950

## 2022-10-06 NOTE — Discharge Instructions (Addendum)
Instructed patient to take medication as directed with food to completion.  Encouraged increase daily water intake to 64 ounces per day while taking this medication.  Advised if symptoms worsen and/or unresolved please follow-up with PCP, optometry/ophthalmology, or here for further evaluation.

## 2022-10-26 ENCOUNTER — Encounter: Payer: Self-pay | Admitting: Emergency Medicine

## 2022-10-26 ENCOUNTER — Ambulatory Visit
Admission: EM | Admit: 2022-10-26 | Discharge: 2022-10-26 | Disposition: A | Discharge status: Home / Self Care | Payer: No Typology Code available for payment source | Attending: Family Medicine | Admitting: Family Medicine

## 2022-10-26 DIAGNOSIS — H938X3 Other specified disorders of ear, bilateral: Secondary | ICD-10-CM | POA: Diagnosis not present

## 2022-10-26 DIAGNOSIS — J329 Chronic sinusitis, unspecified: Secondary | ICD-10-CM | POA: Diagnosis not present

## 2022-10-26 MED ORDER — AZITHROMYCIN 250 MG PO TABS: 250.0000 mg | ORAL_TABLET | Freq: Every day | ORAL | 0 refills | Status: DC

## 2022-10-26 MED ORDER — PREDNISONE 20 MG PO TABS: 40.0000 mg | ORAL_TABLET | Freq: Every day | ORAL | 0 refills | Status: DC

## 2022-10-26 MED ORDER — FLUCONAZOLE 150 MG PO TABS: 150.0000 mg | ORAL_TABLET | Freq: Every day | ORAL | 0 refills | Status: DC

## 2022-10-26 NOTE — ED Triage Notes (Signed)
Pt c/o right ear fullness and pressure for the last 2 weeks. Has used peroxide with no relief.

## 2022-10-26 NOTE — Discharge Instructions (Signed)
Take the antibiotic as directed.  You will take 2 pills today, and then 1 a day until gone Make sure that you are drinking lots of water Take the prednisone once a day for 5 days See your doctor if not improving by next week

## 2022-10-26 NOTE — ED Provider Notes (Signed)
[name]    CSN: [id number] Arrival date & time: 10/26/22  1826      History   Chief Complaint Chief Complaint  Patient presents with   Ear Fullness    HPI Sierra Henderson is a 52 y.o. female.   HPI  Patient is known to me from prior visits.  She has recurring sinus infections and upper respiratory problems.  She has current sinus drainage postnasal drip and sore throat  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   Lumbar hernia 07/14/2020   Lipoma of torso 06/16/2020   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   Intertriginous candidiasis 11/14/2017   Uncontrolled type 2 diabetes mellitus with hyperglycemia (HCC) 11/14/2017   Vitamin D deficiency 11/14/2017   Mild intermittent asthma without complication 04/05/2016   Class 2 severe obesity due to excess calories with serious comorbidity and body mass index (BMI) of 36.0 to 36.9 in adult (HCC) 03/04/2016   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      Home Medications    Prior to Admission medications   Medication Sig Start Date End Date Taking? Authorizing Provider  azithromycin (ZITHROMAX) 250 MG tablet Take 1 tablet (250 mg total) by mouth daily. Take first 2 tablets together, then 1 every day until finished. 10/26/22  Yes Eustace Moore, MD  fluconazole (DIFLUCAN) 150 MG tablet Take 1 tablet (150 mg total) by mouth daily. Repeat in 1 week if needed 10/26/22  Yes Eustace Moore, MD  predniSONE (DELTASONE) 20 MG tablet Take 2 tablets (40 mg total) by mouth daily with breakfast. 10/26/22  Yes Eustace Moore, MD  Accu-Chek FastClix Lancets MISC See admin instructions. 05/12/21   [provider]  albuterol (VENTOLIN HFA) 108 (90 Base) MCG/ACT inhaler Inhale 2 puffs into the lungs every 4 (four) hours as needed for wheezing or shortness of breath. 07/19/22   Eustace Moore, MD  atorvastatin (LIPITOR) 10 MG tablet Take 1 tablet by mouth at bedtime. 06/05/18   [provider]  BD PEN NEEDLE MICRO U/F 32G X 6 MM MISC SMARTSIG:1 Injection Daily 04/20/22   [provider]  betamethasone valerate ointment (VALISONE) 0.1 % Apply to affected areas twice daily as needed for drug rash 07/10/18   [provider]  Cholecalciferol (VITAMIN D3) 1.25 MG (50000 UT) CAPS Take 1  capsule by mouth once a week.    [provider]  clobetasol ointment (TEMOVATE) 0.05 % Apply topically. 10/28/20   [provider]  diclofenac Sodium (VOLTAREN) 1 % GEL APPLY   TOPICALLY TO AFFECTED AREA 4 TIMES DAILY AS NEEDED 01/05/22   [provider]  dicyclomine (BENTYL) 20 MG tablet Take by mouth. 05/23/20   [provider]  gabapentin (NEURONTIN) 300 MG capsule Take 300 mg by mouth 2 (two) times daily.    [provider]  glipiZIDE (GLUCOTROL) 10 MG tablet Take 10 mg by mouth daily before breakfast.    [provider]  glucose blood  (ACCU-CHEK GUIDE) test strip USE 1 STRIP TO CHECK GLUCOSE THREE TIMES DAILY 03/08/22   [provider]  hydrocortisone 2.5 % cream Apply topically. 06/09/20   [provider]  hydroxychloroquine (PLAQUENIL) 200 MG tablet Take by mouth 2 (two) times daily.    [provider]  ibuprofen (ADVIL) 800 MG tablet Take by mouth. 04/23/21   [provider]  Insulin Pen Needle (RAYA SURE PEN NEEDLE) 31G X 6 MM MISC USE 1 PEN NEEDLE ONCE DAILY, AS DIRECTED. 04/14/21   [provider]  JARDIANCE 25 MG TABS tablet Take by mouth. 09/18/20 05/05/23  [provider]  LEVEMIR FLEXPEN 100 UNIT/ML FlexPen SMARTSIG:18 Unit(s) SUB-Q Every Evening    [provider]  levonorgestrel (MIRENA) 20 MCG/24HR IUD 1 each by Intrauterine route once.    [provider]  meloxicam (MOBIC) 7.5 MG tablet Take 7.5 mg by mouth daily.    [provider]  naproxen (NAPROSYN) 500 MG tablet Take 1 tablet by mouth 2 (two) times daily with a meal. 05/05/22   [provider]  norethindrone (AYGESTIN) 5 MG tablet Take by mouth. 05/16/19   [provider]  nystatin (MYCOSTATIN/NYSTOP) powder Apply topically 3 (three) times daily.    [provider]  pantoprazole (PROTONIX) 40 MG tablet Take 1 tablet by mouth daily. 05/23/20   [provider]  pilocarpine (SALAGEN) 5 MG tablet Take 1 tablet (5 mg total) by mouth 3 (three) times daily as needed. 06/02/22   Rice, Jamesetta Orleans, MD  polyethylene glycol powder (GLYCOLAX/MIRALAX) 17 GM/SCOOP powder Take by mouth. 06/25/20   [provider]  sitaGLIPtin (JANUVIA) 50 MG tablet Take 1 tablet by mouth daily. 10/13/20   [provider]  sucralfate (CARAFATE) 1 GM/10ML suspension Take by mouth. 09/11/18   [provider]  tizanidine (ZANAFLEX) 2 MG capsule Take 2 mg by mouth 3 (three) times daily.    [provider]  traMADol (ULTRAM) 50 MG tablet Take by mouth.     [provider]  triamcinolone ointment (KENALOG) 0.5 % Apply 1 application topically 2 (two) times daily.    [provider]    Family History Family History  Problem Relation Age of Onset   Hypertension Mother    Diabetes Mother    Hypertension Father    Diabetes Father     Social History Social History   Tobacco Use   Smoking status: Never    Passive exposure: Past   Smokeless tobacco: Never  Vaping Use   Vaping Use: Never used  Substance Use Topics   Alcohol use: Yes    Comment: socially   Drug use: No     Allergies   Propofol, Latex, Metformin and related, and Augmentin [amoxicillin-pot clavulanate]   Review of Systems Review of Systems   Physical Exam Triage Vital Signs ED Triage  Vitals  Enc Vitals Group     BP 10/26/22 1840 111/73     Pulse Rate 10/26/22 1840 86     Resp 10/26/22 1840 16     Temp 10/26/22 1840 98.2 F (36.8 C)     Temp Source 10/26/22 1840 Oral     SpO2 10/26/22 1840 98 %     Weight --      Height --      Head Circumference --      Peak Flow --      Pain Score 10/26/22 1841 0     Pain Loc --      Pain Edu? --      Excl. in GC? --    No data found.  Updated Vital Signs BP 111/73 (BP Location: Right Arm)   Pulse 86   Temp 98.2 F (36.8 C) (Oral)   Resp 16   SpO2 98%   Visual Acuity Right Eye Distance:   Left Eye Distance:   Bilateral Distance:    Right Eye Near:   Left Eye Near:    Bilateral Near:     Physical Exam   UC Treatments / Results  Labs (all labs ordered are listed, but only abnormal results are displayed) Labs Reviewed - No data to display  EKG   Radiology No results found.  Procedures Procedures (including critical care time)  Medications Ordered in UC Medications - No data to display  Initial Impression / Assessment and Plan / UC Course  I have reviewed the triage vital signs and the nursing notes.  Pertinent labs & imaging results that were available during my care  of the patient were reviewed by me and considered in my medical decision making (see chart for details).     Final Clinical Impressions(s) / UC Diagnoses   Final diagnoses:  Recurrent sinusitis  Sensation of fullness in both ears     Discharge Instructions      Take the antibiotic as directed.  You will take 2 pills today, and then 1 a day until gone Make sure that you are drinking lots of water Take the prednisone once a day for 5 days See your doctor if not improving by next week   ED Prescriptions     Medication Sig Dispense Auth. Provider   azithromycin (ZITHROMAX) 250 MG tablet Take 1 tablet (250 mg total) by mouth daily. Take first 2 tablets together, then 1 every day until finished. 6 tablet Eustace Moore, MD   predniSONE (DELTASONE) 20 MG tablet Take 2 tablets (40 mg total) by mouth daily with breakfast. 10 tablet Eustace Moore, MD   fluconazole (DIFLUCAN) 150 MG tablet Take 1 tablet (150 mg total) by mouth daily. Repeat in 1 week if needed 2 tablet Eustace Moore, MD      PDMP not reviewed this encounter.   Eustace Moore, MD 10/26/22 Barry Brunner

## 2023-06-10 ENCOUNTER — Other Ambulatory Visit: Payer: Self-pay

## 2023-06-10 DIAGNOSIS — M3500 Sicca syndrome, unspecified: Secondary | ICD-10-CM

## 2023-06-10 NOTE — Telephone Encounter (Signed)
Patient states she is not able to schedule the follow-up appointment at this time.  Patient will call back.

## 2023-06-10 NOTE — Telephone Encounter (Signed)
Please schedule patient a follow up visit. Patient due 06/30/2022. Thanks!

## 2023-06-10 NOTE — Telephone Encounter (Signed)
Refill request received via fax from [facility] for Pilocarpine  Last Fill: 06/02/2022  Next Visit: Due 06/30/2022. Message sent to the front to schedule.   Last Visit: 06/02/2022  Dx: Sjogren syndrome, unspecified (HCC)   Current Dose per office note on 06/02/2022: Pilocarpine 5 mg up to 3 times daily for symptom management.   Okay to refill Pilocarpine?

## 2023-06-13 MED ORDER — PILOCARPINE HCL 5 MG PO TABS: 5.0000 mg | ORAL_TABLET | Freq: Three times a day (TID) | ORAL | 1 refills | Status: AC | PRN

## 2023-12-07 ENCOUNTER — Ambulatory Visit
Admission: EM | Admit: 2023-12-07 | Discharge: 2023-12-07 | Disposition: A | Discharge status: Home / Self Care | Attending: Family Medicine | Admitting: Family Medicine

## 2023-12-07 DIAGNOSIS — Z20822 Contact with and (suspected) exposure to covid-19: Secondary | ICD-10-CM

## 2023-12-07 DIAGNOSIS — R059 Cough, unspecified: Secondary | ICD-10-CM

## 2023-12-07 LAB — POC SARS CORONAVIRUS 2 AG -  ED: SARS Coronavirus 2 Ag: NEGATIVE

## 2023-12-07 MED ORDER — PREDNISONE 20 MG PO TABS: ORAL_TABLET | ORAL | 0 refills | Status: AC

## 2023-12-07 NOTE — Discharge Instructions (Addendum)
 Advised patient to take medication as directed with food to completion.  Encourage increase daily water intake to 64 ounces per day while taking his medication.  Advised if symptoms worsen and/or unresolved please follow-up with your PCP or here for further evaluation.

## 2023-12-07 NOTE — ED Provider Notes (Signed)
 [facility]    CSN: [id number] Arrival date & time: 12/07/23  1726      History   Chief Complaint Chief Complaint  Patient presents with   Cough   Nasal Congestion    HPI Sierra Henderson is a 53 y.o. female.   HPI 53 year old female presents with fatigue, cough, and congestion for 3-4 days.  PMH significant for obesity, sleep apnea, and cervical spinal stenosis.  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   Lumbar hernia 07/14/2020   Lipoma of torso 06/16/2020   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   Intertriginous candidiasis 11/14/2017   Uncontrolled type 2 diabetes mellitus with hyperglycemia (HCC) 11/14/2017   Vitamin D deficiency 11/14/2017   Mild intermittent asthma without complication 04/05/2016   Class 2 severe obesity due to excess calories with serious comorbidity and body mass index (BMI) of 36.0 to 36.9 in adult (HCC) 03/04/2016   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      Home Medications    Prior to Admission medications   Medication Sig Start Date End Date Taking? Authorizing Provider  predniSONE  (DELTASONE ) 20 MG tablet Take 3 tabs PO daily x 5 days. 12/07/23  Yes Teddy Sharper, FNP  Accu-Chek FastClix Lancets MISC See admin instructions. 05/12/21   [provider]  albuterol  (VENTOLIN  HFA) 108 (90 Base) MCG/ACT inhaler Inhale 2 puffs into the lungs every 4 (four) hours as needed for wheezing or shortness of breath. 07/19/22   Maranda Jamee Jacob, MD  atorvastatin (LIPITOR) 10 MG tablet Take 1 tablet by mouth at bedtime. 06/05/18   [provider]  azithromycin  (ZITHROMAX ) 250 MG tablet Take 1 tablet (250 mg total) by mouth daily. Take first 2 tablets together, then 1 every day until finished. 10/26/22   Maranda Jamee Jacob, MD  BD PEN NEEDLE MICRO U/F 32G X 6 MM MISC SMARTSIG:1 Injection Daily 04/20/22   [provider]  betamethasone  valerate ointment (VALISONE ) 0.1 % Apply to affected areas twice daily as needed for drug rash 07/10/18   [provider]  Cholecalciferol (VITAMIN D3) 1.25 MG (50000 UT) CAPS Take 1 capsule by mouth once a week.    [provider]  clobetasol ointment (TEMOVATE) 0.05 % Apply topically. 10/28/20   [provider]  diclofenac Sodium (VOLTAREN) 1 % GEL APPLY   TOPICALLY  TO AFFECTED AREA 4 TIMES DAILY AS NEEDED 01/05/22   [provider]  dicyclomine (BENTYL) 20 MG tablet Take by mouth. 05/23/20   [provider]  fluconazole  (DIFLUCAN ) 150 MG tablet Take 1 tablet (150 mg total) by mouth daily. Repeat in 1 week if needed 10/26/22   Maranda Jamee Jacob, MD  gabapentin (NEURONTIN) 300 MG capsule Take 300 mg by mouth 2 (two) times daily.    [provider]  glipiZIDE (GLUCOTROL) 10 MG tablet Take 10 mg by mouth daily before breakfast.    [provider]  glucose blood (ACCU-CHEK GUIDE) test strip USE 1 STRIP TO CHECK  GLUCOSE THREE TIMES DAILY 03/08/22   [provider]  hydrocortisone 2.5 % cream Apply topically. 06/09/20   [provider]  hydroxychloroquine (PLAQUENIL) 200 MG tablet Take by mouth 2 (two) times daily.    [provider]  ibuprofen (ADVIL) 800 MG tablet Take by mouth. 04/23/21   [provider]  Insulin  Pen Needle (RAYA SURE PEN NEEDLE) 31G X 6 MM MISC USE 1 PEN NEEDLE ONCE DAILY, AS DIRECTED. 04/14/21   [provider]  LEVEMIR FLEXPEN 100 UNIT/ML FlexPen SMARTSIG:18 Unit(s) SUB-Q Every Evening    [provider]  levonorgestrel  (MIRENA ) 20 MCG/24HR IUD 1 each by Intrauterine route once.    [provider]  meloxicam (MOBIC) 7.5 MG tablet Take 7.5 mg by mouth daily.    [provider]  naproxen (NAPROSYN) 500 MG tablet Take 1 tablet by mouth 2 (two) times daily with a meal. 05/05/22   [provider]  norethindrone (AYGESTIN) 5 MG tablet Take by mouth. 05/16/19   [provider]  nystatin (MYCOSTATIN/NYSTOP) powder Apply topically 3 (three) times daily.    [provider]  pantoprazole (PROTONIX) 40 MG tablet Take 1 tablet by mouth daily. 05/23/20   [provider]  pilocarpine  (SALAGEN ) 5 MG tablet Take 1 tablet (5 mg total) by mouth 3 (three) times daily as needed. 06/13/23   Rice, Lonni ORN, MD  polyethylene glycol powder (GLYCOLAX/MIRALAX) 17 GM/SCOOP powder Take by mouth. 06/25/20   [provider]  sitaGLIPtin (JANUVIA) 50 MG tablet Take 1 tablet by mouth daily. 10/13/20   [provider]  sucralfate (CARAFATE) 1 GM/10ML suspension Take by mouth. 09/11/18   [provider]  tizanidine (ZANAFLEX) 2 MG capsule Take 2 mg by mouth 3 (three) times daily.    [provider]  traMADol (ULTRAM) 50 MG tablet Take by mouth.    [provider]  triamcinolone  ointment (KENALOG) 0.5 % Apply 1 application topically 2 (two) times daily.    [provider]    Family History Family History  Problem Relation Age of Onset   Hypertension Mother    Diabetes Mother    Hypertension Father    Diabetes Father     Social History Social History   Tobacco Use   Smoking status: Never    Passive exposure: Past   Smokeless tobacco: Never  Vaping Use   Vaping status: Never Used  Substance Use Topics   Alcohol use: Yes    Comment: socially   Drug use: No     Allergies   Propofol, Latex, Metformin and related, and Augmentin [amoxicillin -pot clavulanate]   Review of Systems Review of Systems  Constitutional:  Positive for fatigue.  HENT:  Positive for congestion.   Respiratory:  Positive for cough.   All other systems reviewed and are negative.    Physical Exam  Triage Vital Signs ED Triage Vitals  Encounter Vitals Group     BP      Girls Systolic BP Percentile      Girls Diastolic BP Percentile      Boys Systolic BP Percentile      Boys Diastolic BP Percentile      Pulse      Resp      Temp      Temp src      SpO2      Weight      Height      Head Circumference      Peak Flow      Pain Score      Pain Loc      Pain Education      Exclude from Growth Chart    No data found.  Updated Vital Signs BP 113/77   Pulse 94   Temp 98.2 F (36.8 C)   Resp 19   SpO2 98%    Physical Exam Vitals and nursing note reviewed.  Constitutional:      General: She is not in acute distress.    Appearance: Normal appearance. She is obese. She is not ill-appearing.  HENT:     Head: Normocephalic and atraumatic.     Right Ear: Tympanic membrane, ear canal and external ear normal.     Left Ear: Tympanic membrane, ear canal and external ear normal.     Mouth/Throat:     Mouth: Mucous membranes are moist.     Pharynx: Oropharynx is clear.  Eyes:     Extraocular Movements: Extraocular movements intact.     Conjunctiva/sclera: Conjunctivae normal.     Pupils: Pupils are equal, round, and reactive to light.   Cardiovascular:     Rate and Rhythm: Normal rate and regular rhythm.     Pulses: Normal pulses.     Heart sounds: Normal heart sounds.  Pulmonary:     Effort: Pulmonary effort is normal.     Breath sounds: Normal breath sounds. No wheezing, rhonchi or rales.     Comments: Infrequent nonproductive cough on exam Musculoskeletal:        General: Normal range of motion.  Skin:    General: Skin is warm and dry.  Neurological:     General: No focal deficit present.     Mental Status: She is alert and oriented to person, place, and time. Mental status is at baseline.  Psychiatric:        Mood and Affect: Mood normal.        Behavior: Behavior normal.      UC Treatments / Results  Labs (all labs ordered are listed, but only abnormal results are displayed) Labs Reviewed  POC SARS CORONAVIRUS 2 AG -  ED    EKG   Radiology No results found.  Procedures Procedures (including critical care time)  Medications Ordered in UC Medications - No data to display  Initial Impression / Assessment and Plan / UC Course  I have reviewed the triage vital signs and the nursing notes.  Pertinent labs & imaging results that were available during my care of the patient were reviewed by me and considered in my medical decision making (see chart for details).     MDM: 1.  Cough, unspecified type-Rx'd prednisone  20 mg tablet: Take 3 tablets p.o. daily x 5 days; 2.  Exposure to COVID-19 virus-COVID-19 negative today in clinic.  Patient advised. Advised patient to take medication as directed with food  to completion.  Encourage increase daily water intake to 64 ounces per day while taking his medication.  Advised if symptoms worsen and/or unresolved please follow-up with your PCP or here for further evaluation.  Patient discharged home, hemodynamically stable. Final Clinical Impressions(s) / UC Diagnoses   Final diagnoses:  Cough, unspecified type  Exposure to COVID-19 virus     Discharge  Instructions      Advised patient to take medication as directed with food to completion.  Encourage increase daily water intake to 64 ounces per day while taking his medication.  Advised if symptoms worsen and/or unresolved please follow-up with your PCP or here for further evaluation.     ED Prescriptions     Medication Sig Dispense Auth. Provider   predniSONE  (DELTASONE ) 20 MG tablet Take 3 tabs PO daily x 5 days. 15 tablet Marchell Froman, FNP      PDMP not reviewed this encounter.   Teddy Sharper, FNP 12/07/23 1829

## 2023-12-07 NOTE — ED Triage Notes (Signed)
 Pt presents to uc with co fatigue, cough, congestion, and loose stool since 3 -4 days ago and was with a friend Sunday that was pos for covid.

## 2024-02-18 ENCOUNTER — Ambulatory Visit
Admission: EM | Admit: 2024-02-18 | Discharge: 2024-02-18 | Disposition: A | Discharge status: Home / Self Care | Attending: Family Medicine | Admitting: Family Medicine

## 2024-02-18 ENCOUNTER — Other Ambulatory Visit: Payer: Self-pay

## 2024-02-18 DIAGNOSIS — J069 Acute upper respiratory infection, unspecified: Secondary | ICD-10-CM

## 2024-02-18 LAB — POC SOFIA SARS ANTIGEN FIA: SARS Coronavirus 2 Ag: NEGATIVE

## 2024-02-18 LAB — POCT RAPID STREP A (OFFICE): Rapid Strep A Screen: NEGATIVE

## 2024-02-18 MED ORDER — AZITHROMYCIN 250 MG PO TABS: 250.0000 mg | ORAL_TABLET | Freq: Every day | ORAL | 0 refills | Status: AC

## 2024-02-18 MED ORDER — FLUCONAZOLE 150 MG PO TABS: 150.0000 mg | ORAL_TABLET | Freq: Every day | ORAL | 0 refills | Status: AC

## 2024-02-18 NOTE — ED Triage Notes (Signed)
 Sore throat x 2 days. Has some sob, dark phlegm, muffled voice, cough is dry. No fever. No otc meds.

## 2024-02-18 NOTE — ED Provider Notes (Signed)
 [facility]    CSN: [id number] Arrival date & time: 02/18/24  0946      History   Chief Complaint No chief complaint on file.   HPI Sierra Henderson is a 53 y.o. female.   HPI  Patient states she has an upper respiratory infection.  Sinus congestion, postnasal drip, brown mucus, sore throat, headache, and some coughing.  Her symptoms been going on for 2 to 3 days.  She is here because her husband has similar symptoms and was seen here yesterday.  He was started on an antibiotic and states he feels better today.  She believes they have the same thing, and that she needs an antibiotic.  She does express help allergy to Augmentin because of GI distress.  She has not had any fever chills headache or body ache.  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   Lumbar hernia 07/14/2020   Lipoma of torso 06/16/2020   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   Intertriginous candidiasis 11/14/2017   Uncontrolled type 2 diabetes mellitus with hyperglycemia (HCC) 11/14/2017   Vitamin D deficiency 11/14/2017   Mild intermittent asthma without complication 04/05/2016   Class 2 severe obesity due to excess calories with serious comorbidity and body mass index (BMI) of 36.0 to 36.9 in adult 03/04/2016   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      Home Medications    Prior to Admission medications   Medication Sig Start Date End Date Taking? Authorizing Provider  Accu-Chek FastClix Lancets MISC See admin instructions. 05/12/21   [provider]  albuterol  (VENTOLIN  HFA) 108 (90 Base) MCG/ACT inhaler Inhale 2 puffs into the lungs every 4 (four) hours as needed for wheezing or shortness of breath. 07/19/22   Maranda Jamee Jacob, MD  atorvastatin (LIPITOR) 10 MG tablet Take 1 tablet by mouth at bedtime. 06/05/18   [provider]  azithromycin  (ZITHROMAX ) 250 MG tablet Take 1 tablet (250 mg total) by mouth daily. Take first 2 tablets together, then 1 every day until finished. 02/18/24   Maranda Jamee Jacob, MD  BD PEN NEEDLE MICRO U/F 32G X 6 MM MISC SMARTSIG:1 Injection Daily 04/20/22   [provider]  betamethasone  valerate ointment (VALISONE ) 0.1 % Apply to affected areas twice daily as needed for drug rash 07/10/18   [provider]  Cholecalciferol (VITAMIN  D3) 1.25 MG (50000 UT) CAPS Take 1 capsule by mouth once a week.    [provider]  clobetasol ointment (TEMOVATE) 0.05 % Apply topically. 10/28/20   [provider]  diclofenac Sodium (VOLTAREN) 1 % GEL APPLY   TOPICALLY TO AFFECTED AREA 4 TIMES DAILY AS NEEDED 01/05/22   [provider]  dicyclomine (BENTYL) 20 MG tablet Take by mouth. 05/23/20   [provider]  fluconazole  (DIFLUCAN ) 150 MG tablet Take 1 tablet (150 mg total) by mouth daily. Repeat in 1 week if needed 02/18/24   Maranda Jamee Jacob, MD  gabapentin (NEURONTIN) 300 MG capsule Take 300 mg by  mouth 2 (two) times daily.    [provider]  glipiZIDE (GLUCOTROL) 10 MG tablet Take 10 mg by mouth daily before breakfast.    [provider]  glucose blood (ACCU-CHEK GUIDE) test strip USE 1 STRIP TO CHECK GLUCOSE THREE TIMES DAILY 03/08/22   [provider]  hydrocortisone 2.5 % cream Apply topically. 06/09/20   [provider]  hydroxychloroquine (PLAQUENIL) 200 MG tablet Take by mouth 2 (two) times daily.    [provider]  ibuprofen (ADVIL) 800 MG tablet Take by mouth. 04/23/21   [provider]  Insulin  Pen Needle (RAYA SURE PEN NEEDLE) 31G X 6 MM MISC USE 1 PEN NEEDLE ONCE DAILY, AS DIRECTED. 04/14/21   [provider]  LEVEMIR FLEXPEN 100 UNIT/ML FlexPen SMARTSIG:18 Unit(s) SUB-Q Every Evening    [provider]  levonorgestrel  (MIRENA ) 20 MCG/24HR IUD 1 each by Intrauterine route once.    [provider]  meloxicam (MOBIC) 7.5 MG tablet Take 7.5 mg by mouth daily.    [provider]  naproxen (NAPROSYN) 500 MG tablet Take 1 tablet by mouth 2 (two) times daily with a meal. 05/05/22   [provider]  norethindrone (AYGESTIN) 5 MG tablet Take by mouth. 05/16/19   [provider]  nystatin (MYCOSTATIN/NYSTOP) powder Apply topically 3 (three) times daily.    [provider]  pantoprazole (PROTONIX) 40 MG tablet Take 1 tablet by mouth daily. 05/23/20   [provider]  pilocarpine  (SALAGEN ) 5 MG tablet Take 1 tablet (5 mg total) by mouth 3 (three) times daily as needed. 06/13/23   Rice, Lonni ORN, MD  polyethylene glycol powder (GLYCOLAX/MIRALAX) 17 GM/SCOOP powder Take by mouth. 06/25/20   [provider]  predniSONE  (DELTASONE ) 20 MG tablet Take 3 tabs PO daily x 5 days. 12/07/23   Ragan, Michael, FNP  sitaGLIPtin (JANUVIA) 50 MG tablet Take 1 tablet by mouth daily. 10/13/20   [provider]  sucralfate (CARAFATE) 1 GM/10ML suspension Take by mouth.  09/11/18   [provider]  tizanidine (ZANAFLEX) 2 MG capsule Take 2 mg by mouth 3 (three) times daily.    [provider]  traMADol (ULTRAM) 50 MG tablet Take by mouth.    [provider]  triamcinolone  ointment (KENALOG) 0.5 % Apply 1 application topically 2 (two) times daily.    [provider]    Family History Family History  Problem Relation Age of Onset   Hypertension Mother    Diabetes Mother    Hypertension Father    Diabetes Father     Social History Social History   Tobacco Use   Smoking status: Never    Passive exposure: Past   Smokeless tobacco: Never  Vaping Use   Vaping status: Never Used  Substance Use Topics   Alcohol use: Yes  Comment: socially   Drug use: No     Allergies   Propofol, Latex, Metformin and related, and Augmentin [amoxicillin -pot clavulanate]   Review of Systems Review of Systems See HPI  Physical Exam Triage Vital Signs ED Triage Vitals  Encounter Vitals Group     BP 02/18/24 0952 114/77     Girls Systolic BP Percentile --      Girls Diastolic BP Percentile --      Boys Systolic BP Percentile --      Boys Diastolic BP Percentile --      Pulse Rate 02/18/24 0952 (!) 104     Resp 02/18/24 0952 16     Temp 02/18/24 0952 98.2 F (36.8 C)     Temp src --      SpO2 02/18/24 0952 96 %     Weight --      Height --      Head Circumference --      Peak Flow --      Pain Score 02/18/24 0955 8     Pain Loc --      Pain Education --      Exclude from Growth Chart --    No data found.  Updated Vital Signs BP 114/77   Pulse (!) 104   Temp 98.2 F (36.8 C)   Resp 16   SpO2 96%       Physical Exam Constitutional:      General: She is not in acute distress.    Appearance: She is well-developed. She is ill-appearing.  HENT:     Head: Normocephalic and atraumatic.     Right Ear: Tympanic membrane normal.     Left Ear: Tympanic membrane normal.     Nose: Congestion and rhinorrhea  present.     Mouth/Throat:     Mouth: Mucous membranes are moist.     Pharynx: Posterior oropharyngeal erythema present.     Comments: Tonsils surgically removed.  Uvula swollen and erythematous Eyes:     Conjunctiva/sclera: Conjunctivae normal.     Pupils: Pupils are equal, round, and reactive to light.  Cardiovascular:     Rate and Rhythm: Normal rate and regular rhythm.     Heart sounds: Normal heart sounds.  Pulmonary:     Effort: Pulmonary effort is normal. No respiratory distress.     Breath sounds: Normal breath sounds.  Musculoskeletal:        General: Normal range of motion.     Cervical back: Normal range of motion.  Lymphadenopathy:     Cervical: Cervical adenopathy present.  Skin:    General: Skin is warm and dry.  Neurological:     Mental Status: She is alert.      UC Treatments / Results  Labs (all labs ordered are listed, but only abnormal results are displayed) Labs Reviewed  POC SOFIA SARS ANTIGEN FIA  POCT RAPID STREP A (OFFICE)    EKG   Radiology No results found.  Procedures Procedures (including critical care time)  Medications Ordered in UC Medications - No data to display  Initial Impression / Assessment and Plan / UC Course  I have reviewed the triage vital signs and the nursing notes.  Pertinent labs & imaging results that were available during my care of the patient were reviewed by me and considered in my medical decision making (see chart for details).     Discussed with patient she likely has a viral upper respiratory infection.  Because of her husband's  condition, she desires antibiotics.  She is given a prescription for written antibiotic to take if she fails to improve in a couple of days Final Clinical Impressions(s) / UC Diagnoses   Final diagnoses:  Viral upper respiratory infection     Discharge Instructions      Make sure you are drinking lots of fluids Try Flonase  or a nasal steroid for the congestion and  postnasal drip I have prescribed an antibiotic to take in case you fail to see improvement in a couple of days   ED Prescriptions     Medication Sig Dispense Auth. Provider   azithromycin  (ZITHROMAX ) 250 MG tablet Take 1 tablet (250 mg total) by mouth daily. Take first 2 tablets together, then 1 every day until finished. 6 tablet Maranda Jamee Jacob, MD   fluconazole  (DIFLUCAN ) 150 MG tablet Take 1 tablet (150 mg total) by mouth daily. Repeat in 1 week if needed 2 tablet Maranda Jamee Jacob, MD      PDMP not reviewed this encounter.   Maranda Jamee Jacob, MD 02/18/24 [phone]

## 2024-02-18 NOTE — Discharge Instructions (Addendum)
 Make sure you are drinking lots of fluids Try Flonase  or a nasal steroid for the congestion and postnasal drip I have prescribed an antibiotic to take in case you fail to see improvement in a couple of days

## 2024-02-19 ENCOUNTER — Telehealth: Payer: Self-pay | Admitting: Emergency Medicine

## 2024-02-19 NOTE — Telephone Encounter (Signed)
 Spoke with patient states that she has started the Z-pack and really not seeing any improvement.  Advised patient that she needs to give the medication some time to work in her system.  Patient also stated that normally she has to take 2 rounds of the Z-Pack to start feeling better.  Instructed patient that she would need to follow up with her PCP or seen again in the UC as we don't do refills on medication.  Voices understanding.
